# Patient Record
Sex: Male | Born: 1995
Health system: Southern US, Community
[De-identification: ages and names within clinical notes are randomized; demographics above are authoritative.]

## PROBLEM LIST (undated history)

## (undated) DIAGNOSIS — J9819 Other pulmonary collapse: Secondary | ICD-10-CM

## (undated) DIAGNOSIS — G43909 Migraine, unspecified, not intractable, without status migrainosus: Secondary | ICD-10-CM

## (undated) HISTORY — DX: Other pulmonary collapse: J98.19

## (undated) HISTORY — DX: Migraine, unspecified, not intractable, without status migrainosus: G43.909

---
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
## 2015-02-01 ENCOUNTER — Ambulatory Visit (INDEPENDENT_AMBULATORY_CARE_PROVIDER_SITE_OTHER): Payer: 59 | Admitting: Family Medicine

## 2015-02-01 ENCOUNTER — Encounter: Payer: Self-pay | Admitting: Family Medicine

## 2015-02-01 VITALS — BP 102/64 | HR 80 | Temp 98.3°F | Resp 16 | Ht 75.0 in | Wt 154.0 lb

## 2015-02-01 DIAGNOSIS — S52509A Unspecified fracture of the lower end of unspecified radius, initial encounter for closed fracture: Secondary | ICD-10-CM | POA: Insufficient documentation

## 2015-02-01 DIAGNOSIS — F419 Anxiety disorder, unspecified: Secondary | ICD-10-CM

## 2015-02-01 DIAGNOSIS — X58XXXA Exposure to other specified factors, initial encounter: Secondary | ICD-10-CM | POA: Insufficient documentation

## 2015-02-01 DIAGNOSIS — F32A Depression, unspecified: Secondary | ICD-10-CM | POA: Insufficient documentation

## 2015-02-01 DIAGNOSIS — R079 Chest pain, unspecified: Secondary | ICD-10-CM | POA: Insufficient documentation

## 2015-02-01 DIAGNOSIS — H04559 Acquired stenosis of unspecified nasolacrimal duct: Secondary | ICD-10-CM | POA: Insufficient documentation

## 2015-02-01 DIAGNOSIS — J90 Pleural effusion, not elsewhere classified: Secondary | ICD-10-CM | POA: Insufficient documentation

## 2015-02-01 DIAGNOSIS — G43009 Migraine without aura, not intractable, without status migrainosus: Secondary | ICD-10-CM | POA: Insufficient documentation

## 2015-02-01 DIAGNOSIS — F988 Other specified behavioral and emotional disorders with onset usually occurring in childhood and adolescence: Secondary | ICD-10-CM

## 2015-02-01 DIAGNOSIS — F329 Major depressive disorder, single episode, unspecified: Secondary | ICD-10-CM | POA: Insufficient documentation

## 2015-02-01 DIAGNOSIS — K589 Irritable bowel syndrome without diarrhea: Secondary | ICD-10-CM | POA: Insufficient documentation

## 2015-02-01 DIAGNOSIS — F909 Attention-deficit hyperactivity disorder, unspecified type: Secondary | ICD-10-CM | POA: Diagnosis not present

## 2015-02-01 DIAGNOSIS — J939 Pneumothorax, unspecified: Secondary | ICD-10-CM | POA: Insufficient documentation

## 2015-02-01 MED ORDER — SERTRALINE HCL 50 MG PO TABS: 50.0000 mg | ORAL_TABLET | Freq: Every day | ORAL | Status: DC

## 2015-02-01 NOTE — Progress Notes (Signed)
Subjective:    Patient ID: Tanner Brown, male    DOB: 1995/09/20, 19 y.o.   MRN: [id number]  HPI Comments: Is concerned about leaving his parents and his girlfriend.   Anxiety Presents for initial visit. Onset was 1 to 6 months ago. The problem has been unchanged. Symptoms include excessive worry, nausea (Only when feeling anxious.  ), nervous/anxious behavior and restlessness. Patient reports no chest pain, compulsions, confusion, decreased concentration, depressed mood, dizziness, dry mouth, feeling of choking, hyperventilation, insomnia, irritability, malaise, muscle tension, palpitations, panic, shortness of breath or suicidal ideas. Symptoms occur occasionally. The severity of symptoms is mild. Exacerbated by: Going to college in the Fall.  The quality of sleep is fair.   His past medical history is significant for depression.    Patient Active Problem List   Diagnosis Date Noted  . Accident 02/01/2015  . ADD (attention deficit disorder) 02/01/2015  . Chest pain 02/01/2015  . Clinical depression 02/01/2015  . Closed fracture of distal end of radius 02/01/2015  . Adaptive colitis 02/01/2015  . Migraine without aura and responsive to treatment 02/01/2015  . 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 Pleural cavity effusion 02/01/2015  . Pneumothorax 02/01/2015  . Spontaneous pneumothorax 09/05/2014   Family History  Problem Relation Age of Onset  . Depression Mother   . Depression Father    History   Social History  . Marital Status: Single    Spouse Name: N/A  . Number of Children: N/A  . Years of Education: H/S   Occupational History  . [employer]    Social History Main Topics  . Smoking status: Never Smoker   . Smokeless tobacco: Never Used  . Alcohol Use: No  . Drug Use: No  . Sexual Activity: Not on file   Other Topics Concern  . Not on file   Social History Narrative   Past Surgical History  Procedure Laterality Date  . Pleural  scarification  2015   No Known Allergies Previous Medications   SUMATRIPTAN (IMITREX) 100 MG TABLET    Take by mouth.   BP 102/64 mmHg  Pulse 80  Temp(Src) 98.3 F (36.8 C) (Oral)  Resp 16  Ht 6\' 3"  (1.905 m)  Wt 154 lb (69.854 kg)  BMI 19.25 kg/m2    Review of Systems  Constitutional: Positive for fatigue. Negative for fever, chills, diaphoresis, activity change, appetite change, irritability and unexpected weight change.  Respiratory: Negative for apnea, cough, choking, chest tightness, shortness of breath, wheezing and stridor.   Cardiovascular: Negative for chest pain, palpitations and leg swelling.  Gastrointestinal: Positive for nausea (Only when feeling anxious.  ). Negative for vomiting, abdominal pain, diarrhea, constipation, blood in stool, abdominal distention, anal bleeding and rectal pain.  Musculoskeletal: Negative for myalgias, back pain, joint swelling, arthralgias, gait problem, neck pain and neck stiffness.  Neurological: Negative for dizziness, tremors, seizures, syncope, speech difficulty, weakness, light-headedness, numbness and headaches.  Psychiatric/Behavioral: Negative for suicidal ideas, confusion and decreased concentration. The patient is nervous/anxious. The patient does not have insomnia.        Objective:   Physical Exam  Constitutional: He is oriented to person, place, and time. He appears well-developed and well-nourished.  Neurological: He is alert and oriented to person, place, and time.  Psychiatric: He has a normal mood and affect. His behavior is normal. Judgment and thought content normal.   BP 102/64 mmHg  Pulse 80  Temp(Src) 98.3 F (36.8 C) (Oral)  Resp 16  Ht  (1.905 m)  Wt 154 lb (69.854 kg)  BMI 19.25 kg/m2     Assessment & Plan:  1. Anxiety Worsening. Start medication. Did have long talk with patient without dad in the room. Also,with dad present. Migraines not current issue. Will sign up for My Chart and be in  communication with me that way.  Did discuss other issues related to health along with anxiety.  Issues related to his girlfriend.  Understands that what we discuss is confidential.  - sertraline (ZOLOFT) 50 MG tablet; Take 1 tablet (50 mg total) by mouth daily.  Dispense: 90 tablet; Refill: 3  2. ADD (attention deficit disorder) Will treat if needed.   Lorie Phenix, MD

## 2015-04-19 ENCOUNTER — Ambulatory Visit: Payer: 59 | Admitting: Family Medicine

## 2015-07-14 DIAGNOSIS — J9819 Other pulmonary collapse: Secondary | ICD-10-CM

## 2015-07-14 HISTORY — DX: Other pulmonary collapse: J98.19

## 2015-09-16 IMAGING — CR DG CHEST 1V PORT
1 series · 2 of 2 positions shown · non-contrast
Comparison: 05/02/2014 earlier same day

CLINICAL DATA: Left-sided pneumothorax, left-sided chest tube
present

EXAM:
PORTABLE CHEST - 1 VIEW

[Series 1: ap · 0.17mm/px · 2 of 2 slices shown]
[im 1/2]
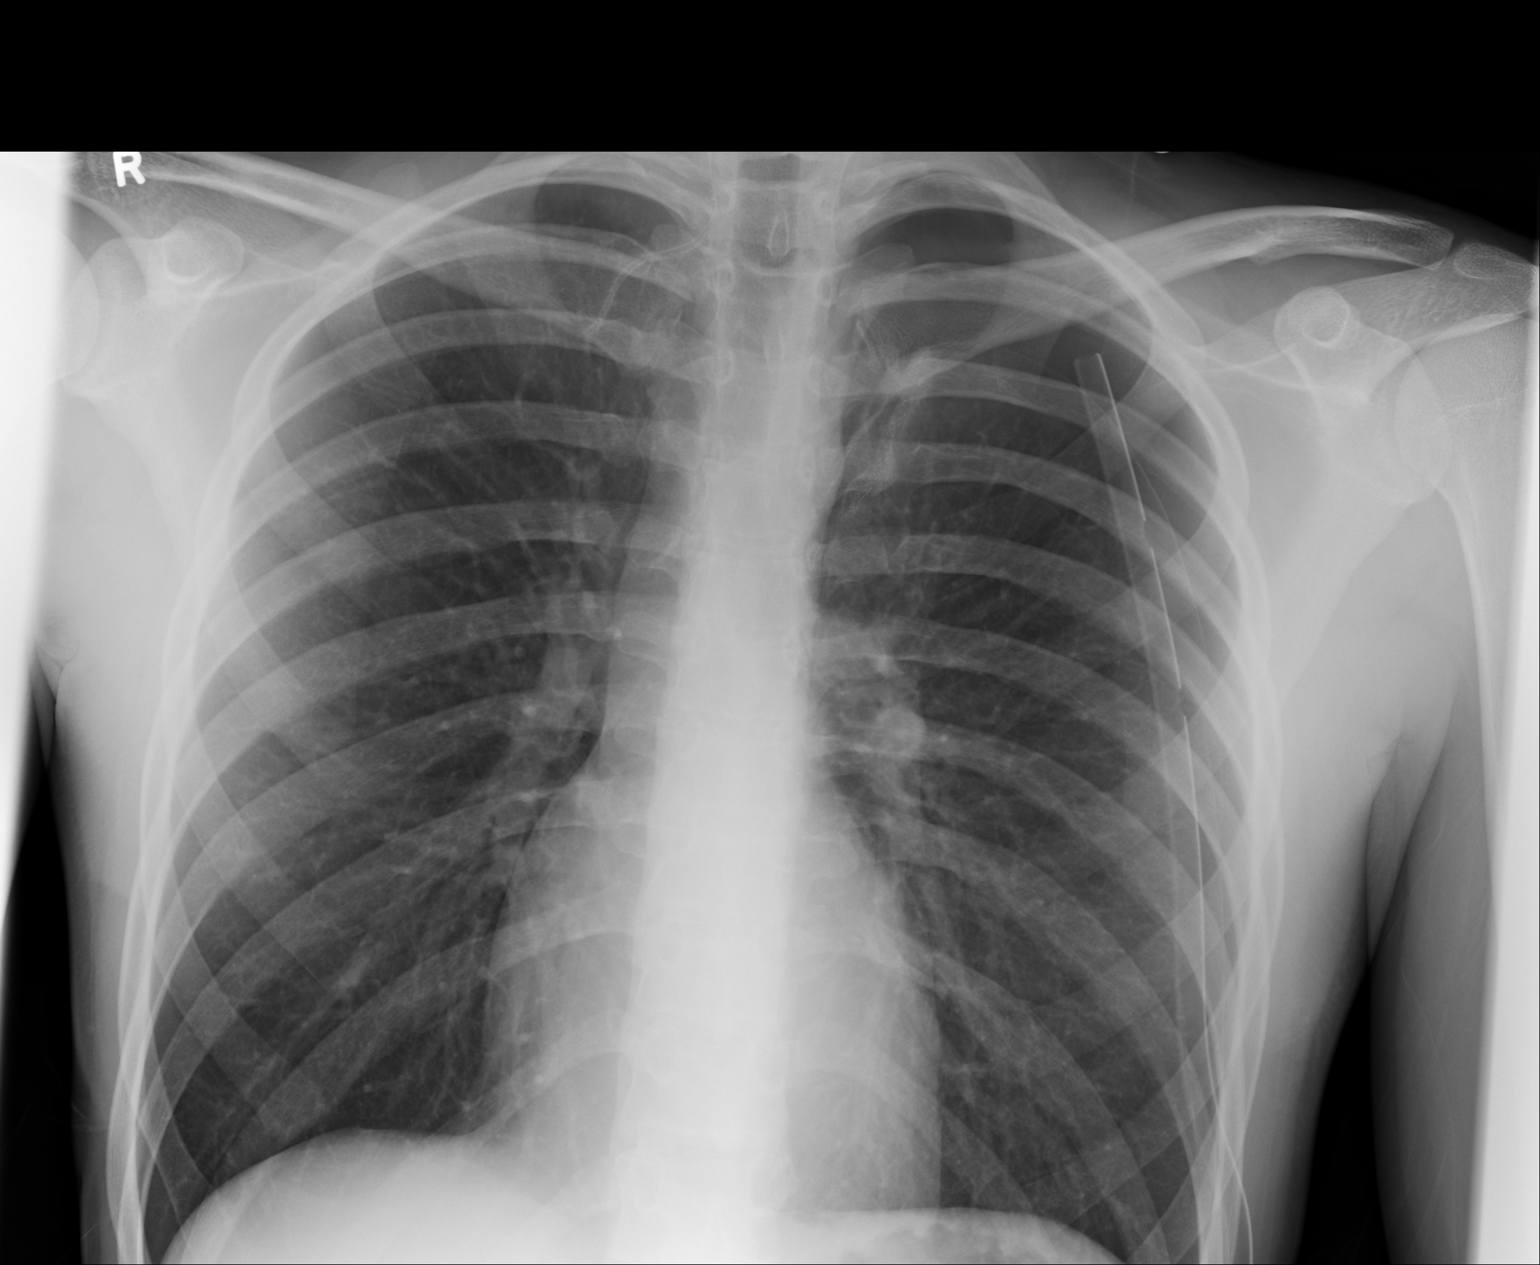
[im 2/2]
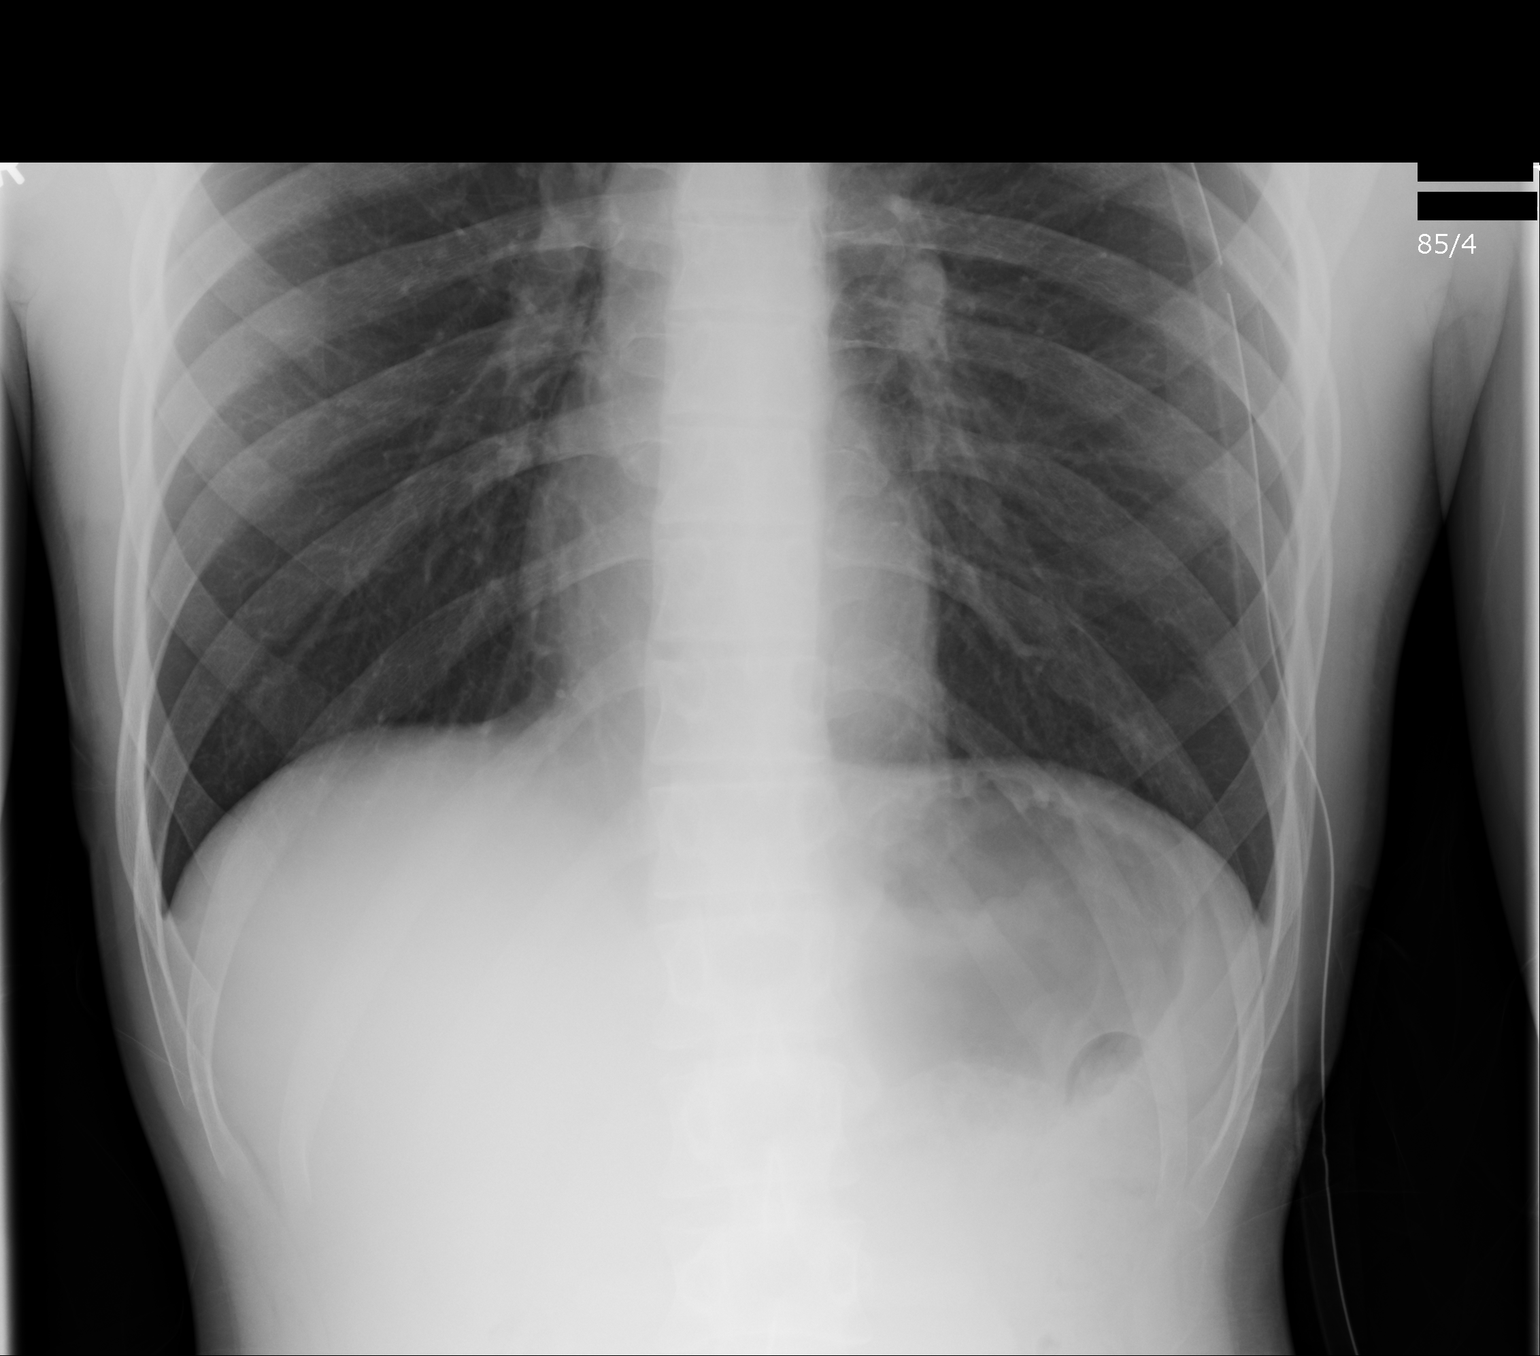

[2 of 2 positions shown; findings below may reference images not displayed]

FINDINGS: Left-sided chest tube directed towards the apex. Moderate left
apical pneumothorax which has enlarged compared with the exam
performed earlier same day.

The lungs are clear. No pleural effusion or focal consolidation. The
heart and mediastinal contours are unremarkable. The osseous
structures are unremarkable.
IMPRESSION: Left-sided chest tube directed towards the apex in unchanged
position. Interval enlargement of the left apical pneumothorax
currently measuring 4.1 cm at the apex compared with the earlier
prior exam at which time the pneumothorax measured 1.7 cm at the
apex.

## 2015-09-17 IMAGING — CR DG CHEST 1V PORT
1 series · 2 of 2 positions shown · non-contrast
Comparison: 05/02/2014

CLINICAL DATA: Left pneumothorax

EXAM:
PORTABLE CHEST - 1 VIEW

[Series 1: ap · 0.17mm/px · 2 of 2 slices shown]
[im 1/2]
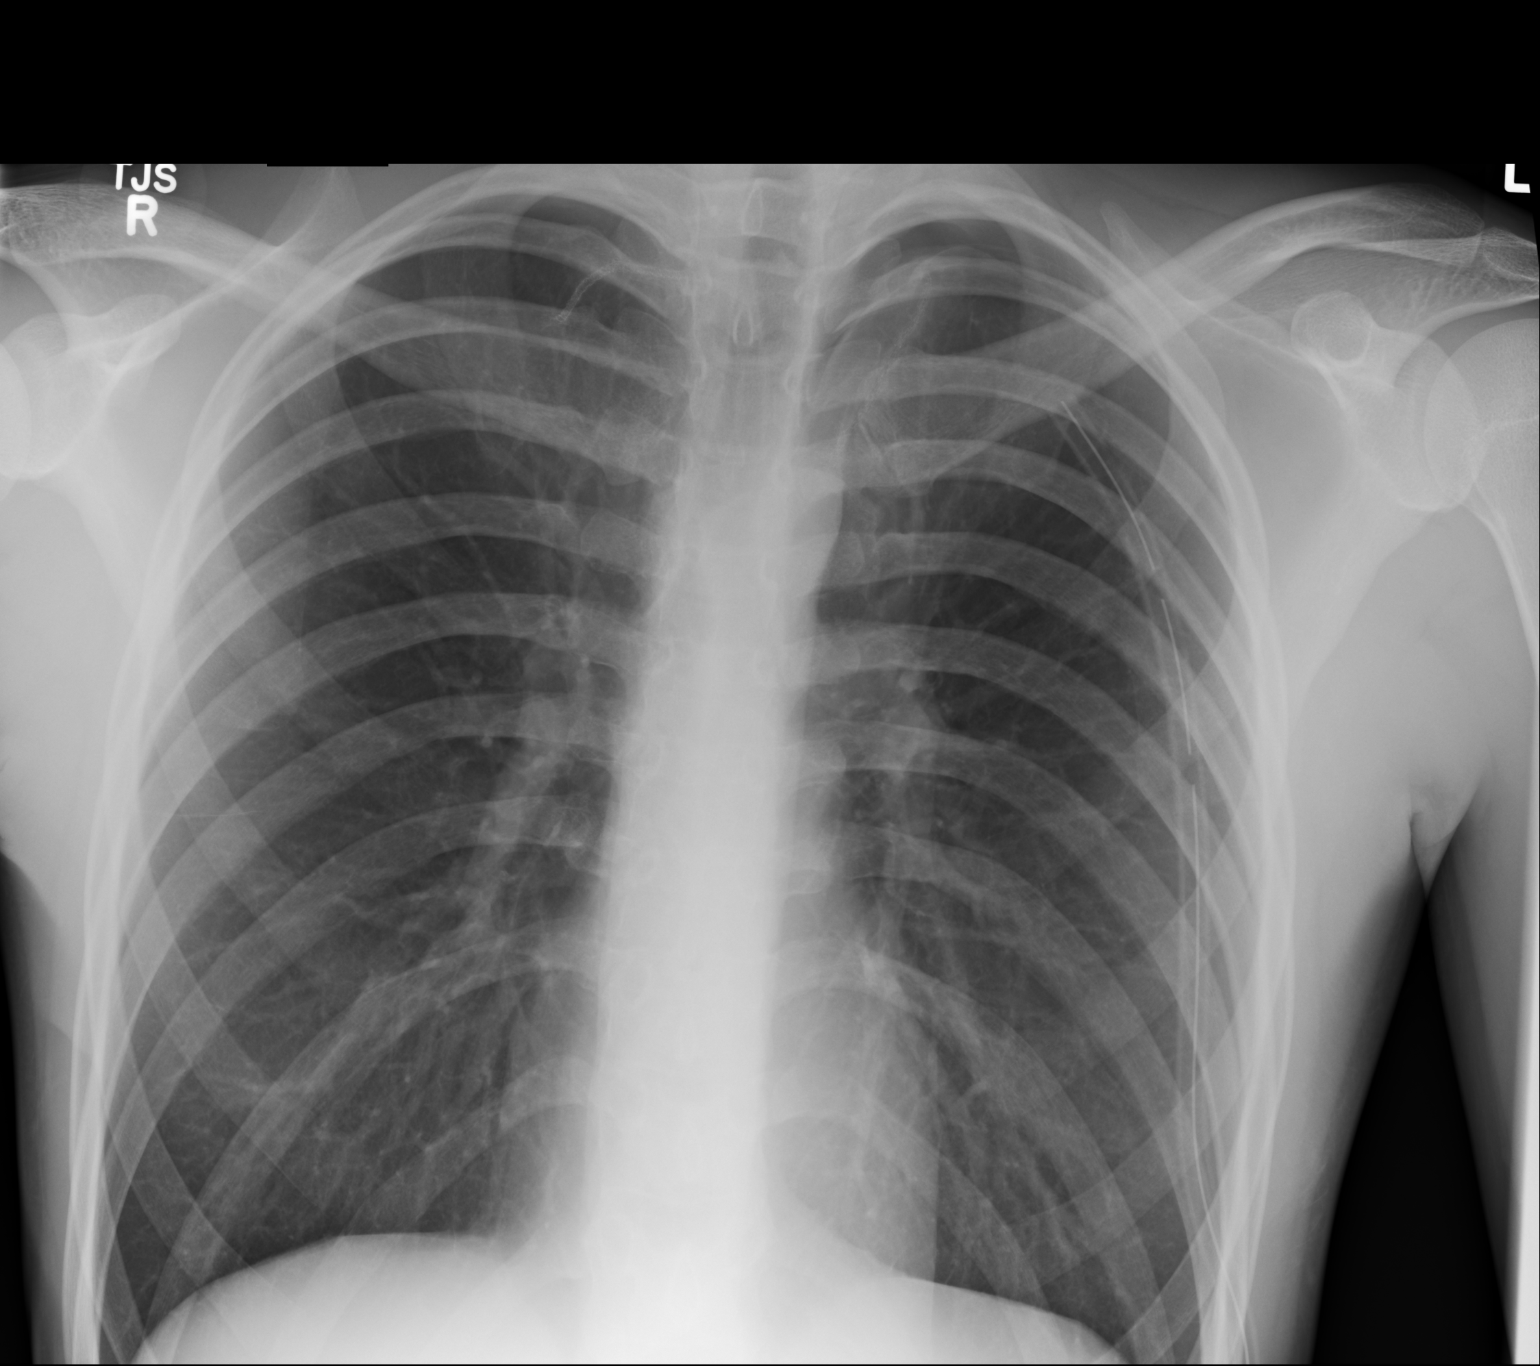
[im 2/2]
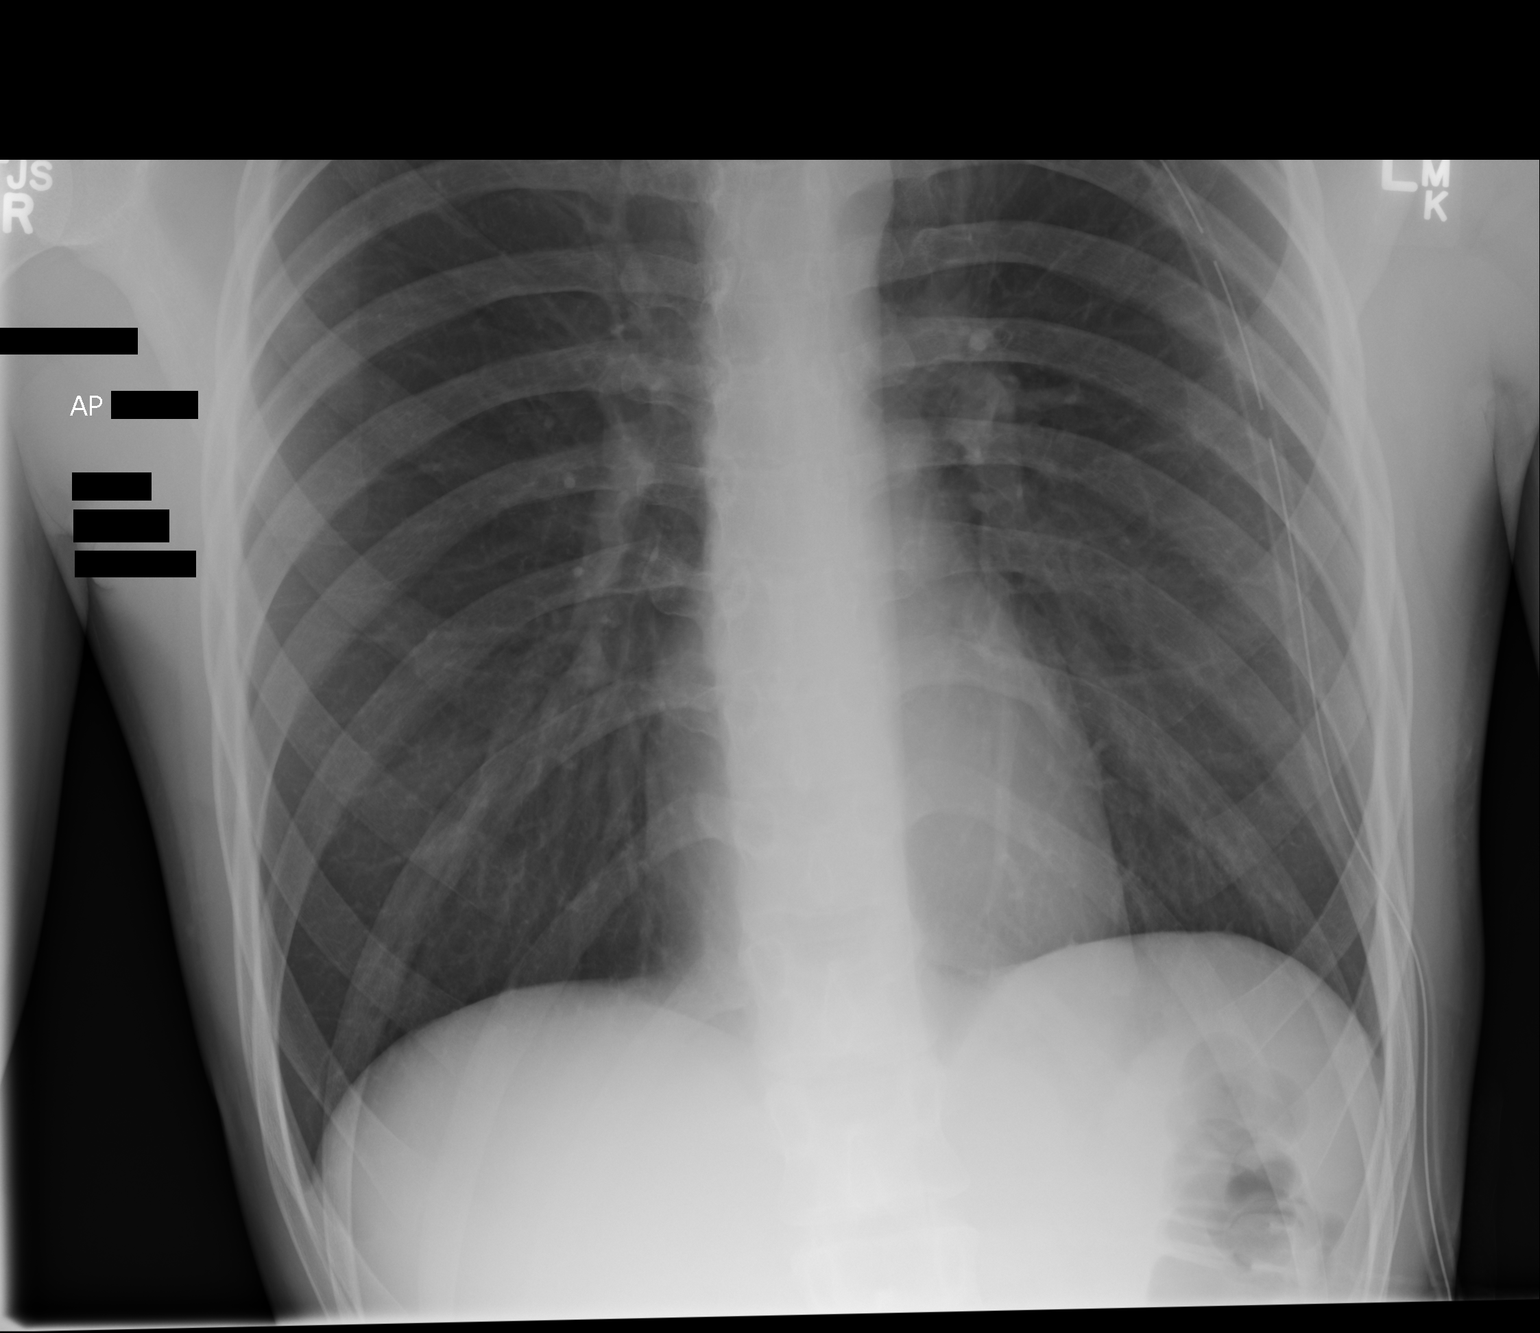

[2 of 2 positions shown; findings below may reference images not displayed]

FINDINGS: Cardiac shadow is within normal limits. A left-sided thoracostomy
catheter remains in place. Postsurgical changes are again noted. A
small left pneumothorax is seen but as improved significantly from
the prior exam. Continued followup is recommended.
IMPRESSION: Significant decrease in left-sided pneumothorax.

## 2015-09-18 IMAGING — CR DG CHEST 1V PORT
1 series · 1 of 1 positions shown · non-contrast
Comparison: Portable chest x-ray of earlier today

CLINICAL DATA: Follow-up of pneumothorax

EXAM:
PORTABLE CHEST - 1 VIEW

[ap]
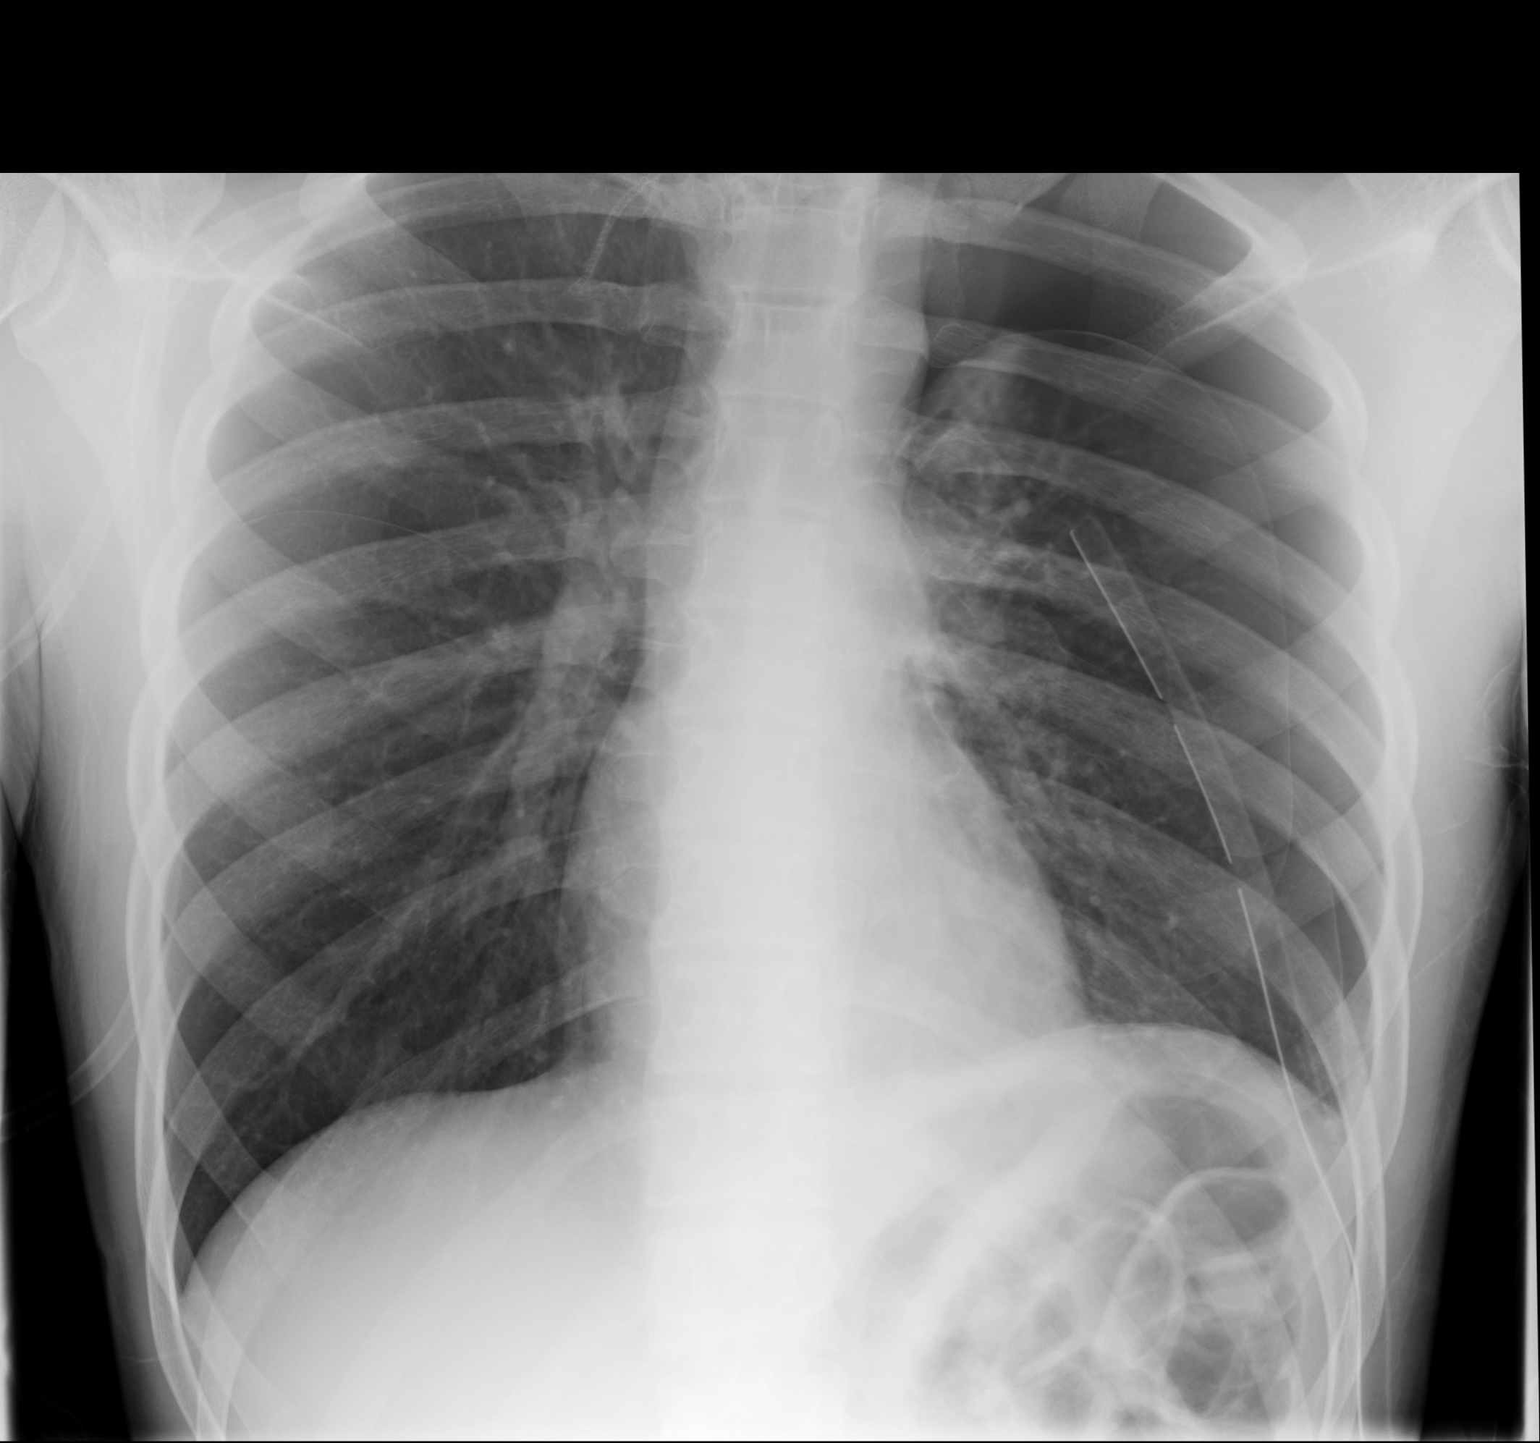

[1 of 1 positions shown; findings below may reference images not displayed]

FINDINGS: The pneumothorax on the left has increased in volume and now amounts
to approximately 50% of the lung volume. There is no mediastinal
shift. There is no pleural effusion. The heart and pulmonary
vascularity are normal.
IMPRESSION: There has been interval increase in a volume of the left
pneumothorax such it now involves approximately 50% of the lung
volume.

Critical Value/emergent results were called by telephone at the time
of interpretation on 05/04/2014 at [DATE] to Celia Angelini, RN,
Charge Nurse,, who verbally acknowledged these results.

## 2015-09-18 IMAGING — CR DG CHEST 1V PORT
1 series · 1 of 1 positions shown · non-contrast
Comparison: Chest x-ray 05/04/2014.

CLINICAL DATA: 18-year-old male with history of pneumothorax with
chest tube in position. Status post tube adjustment.

EXAM:
PORTABLE CHEST - 1 VIEW

[ap]
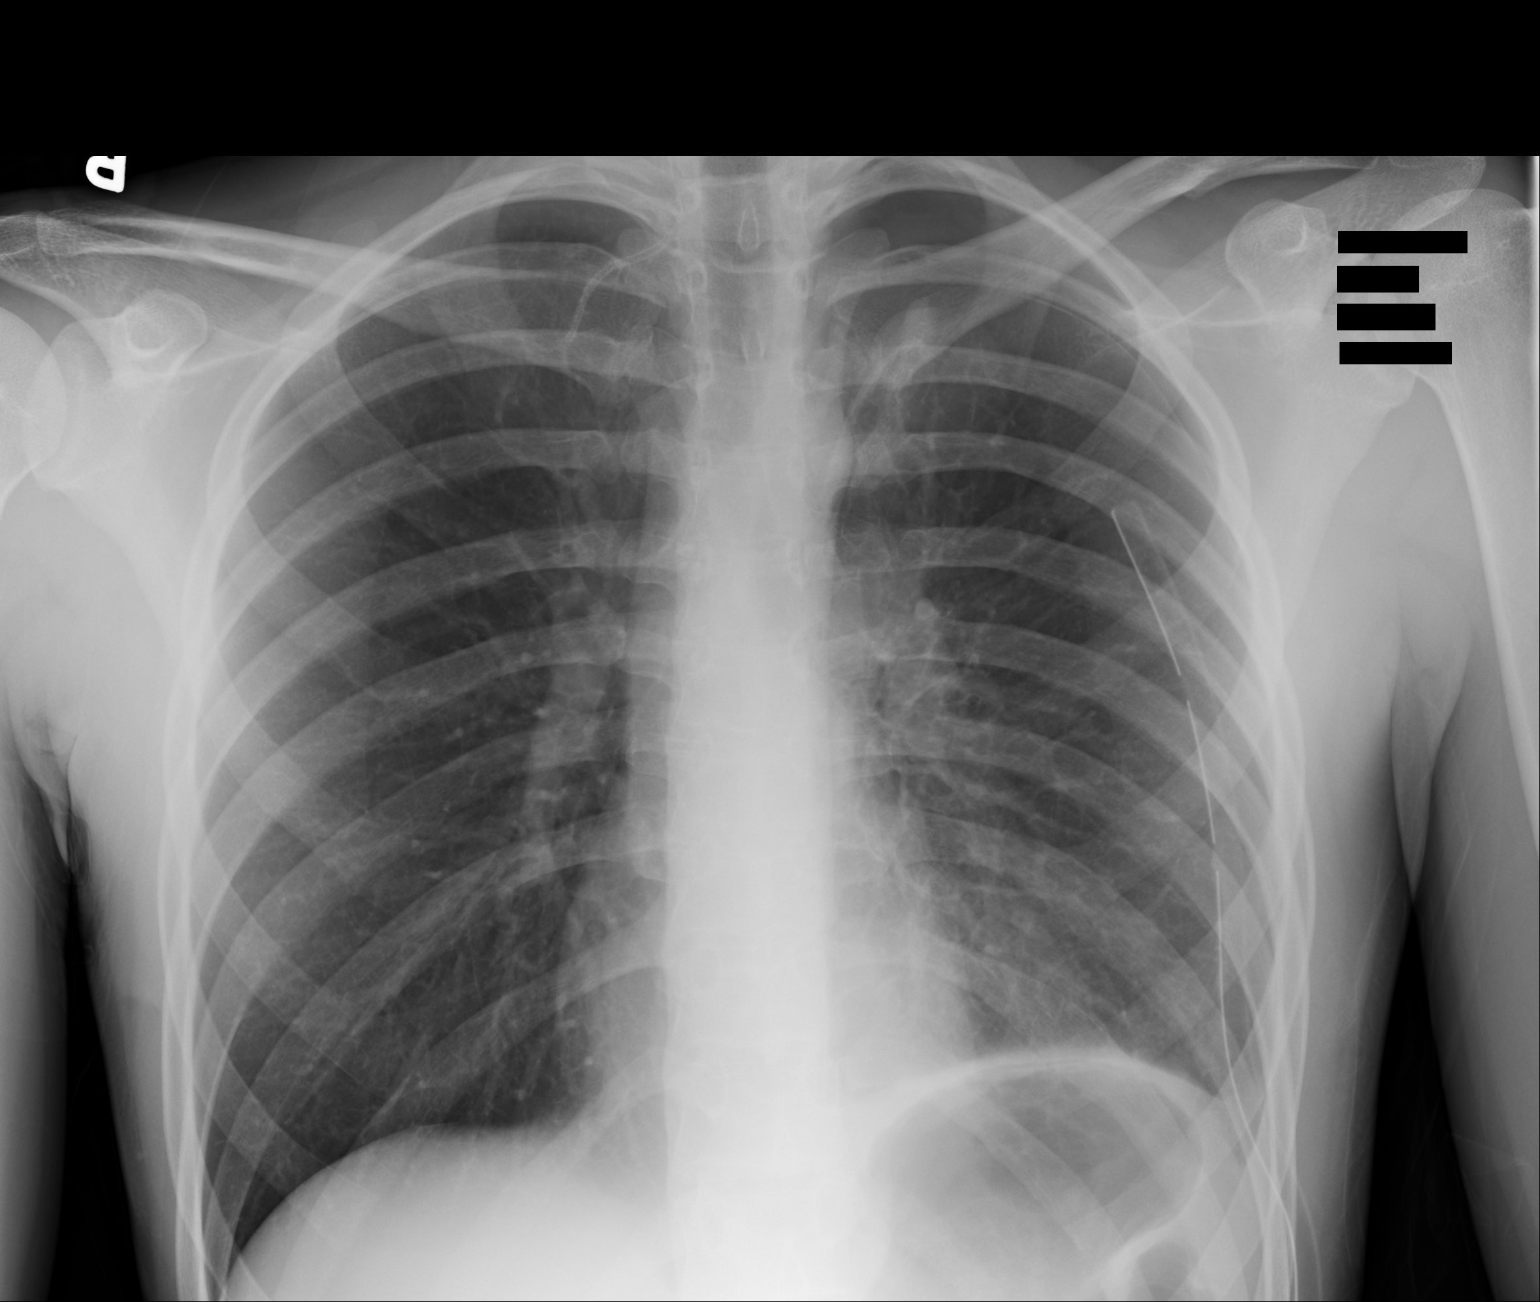

[1 of 1 positions shown; findings below may reference images not displayed]

FINDINGS: Previously noted left-sided chest tube is slightly more lateral in
the left hemithorax, but the tip and 2 side-port superior to project
over the left hemithorax. There has been some interval re-expansion
of the left lung, although there continues to be a moderate
left-sided pneumothorax (approximately 10-15% of the volume of the
left hemithorax). Right lung is well-expanded. No consolidative
airspace disease or pleural effusions. No evidence of pulmonary
edema. Heart size and mediastinal contours are unremarkable.
IMPRESSION: 1. Slight repositioning of the left-sided chest tube with some
interval re-expansion of the left lung, with residual moderate left
pneumothorax, as above.

## 2015-09-20 IMAGING — CR DG CHEST 1V PORT
1 series · 2 of 2 positions shown · non-contrast
Comparison: Portable exam 8803 hr compared to 0213 hr

CLINICAL DATA: Postsurgical chest tube placement

EXAM:
PORTABLE CHEST - 1 VIEW

[Series 1: ap · 0.17mm/px · 2 of 2 slices shown]
[im 1/2]
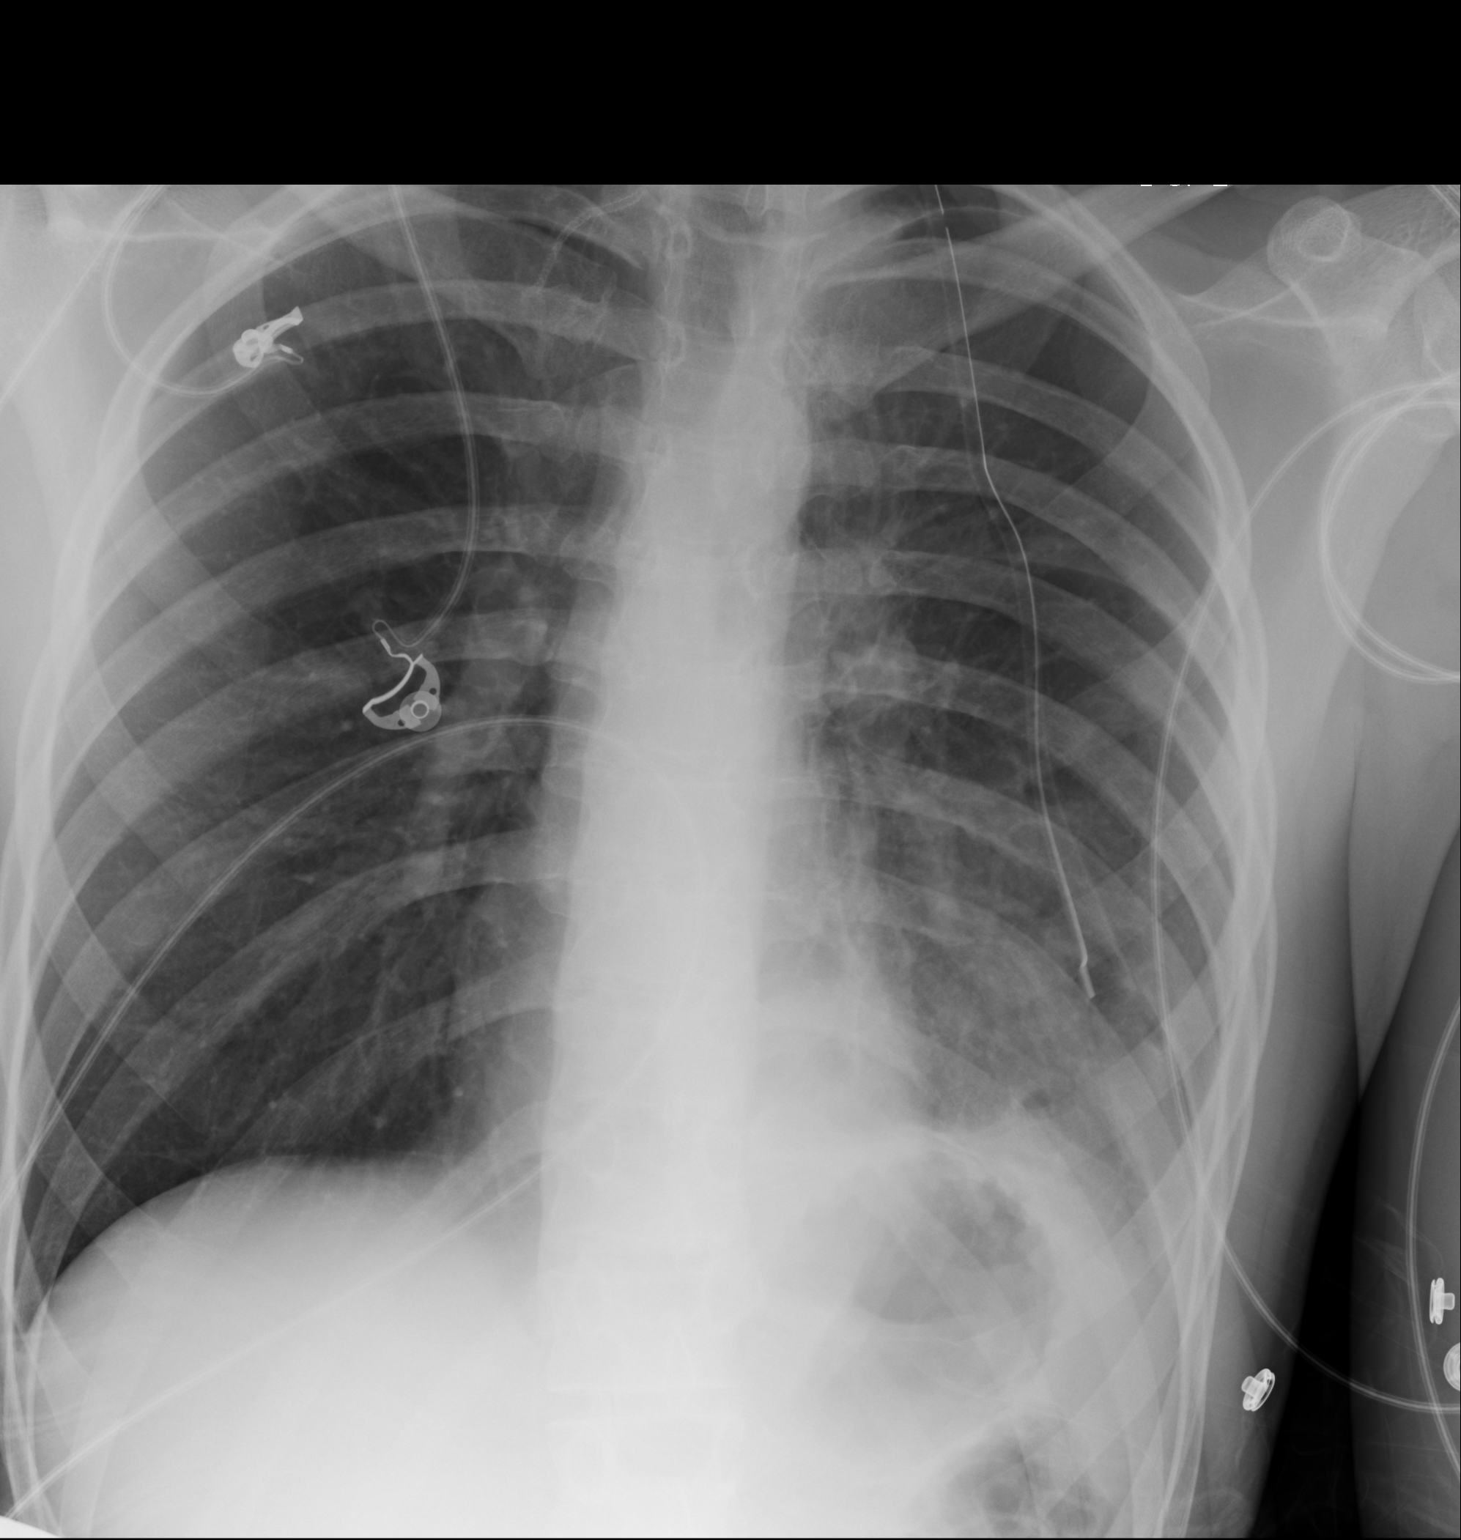
[im 2/2]
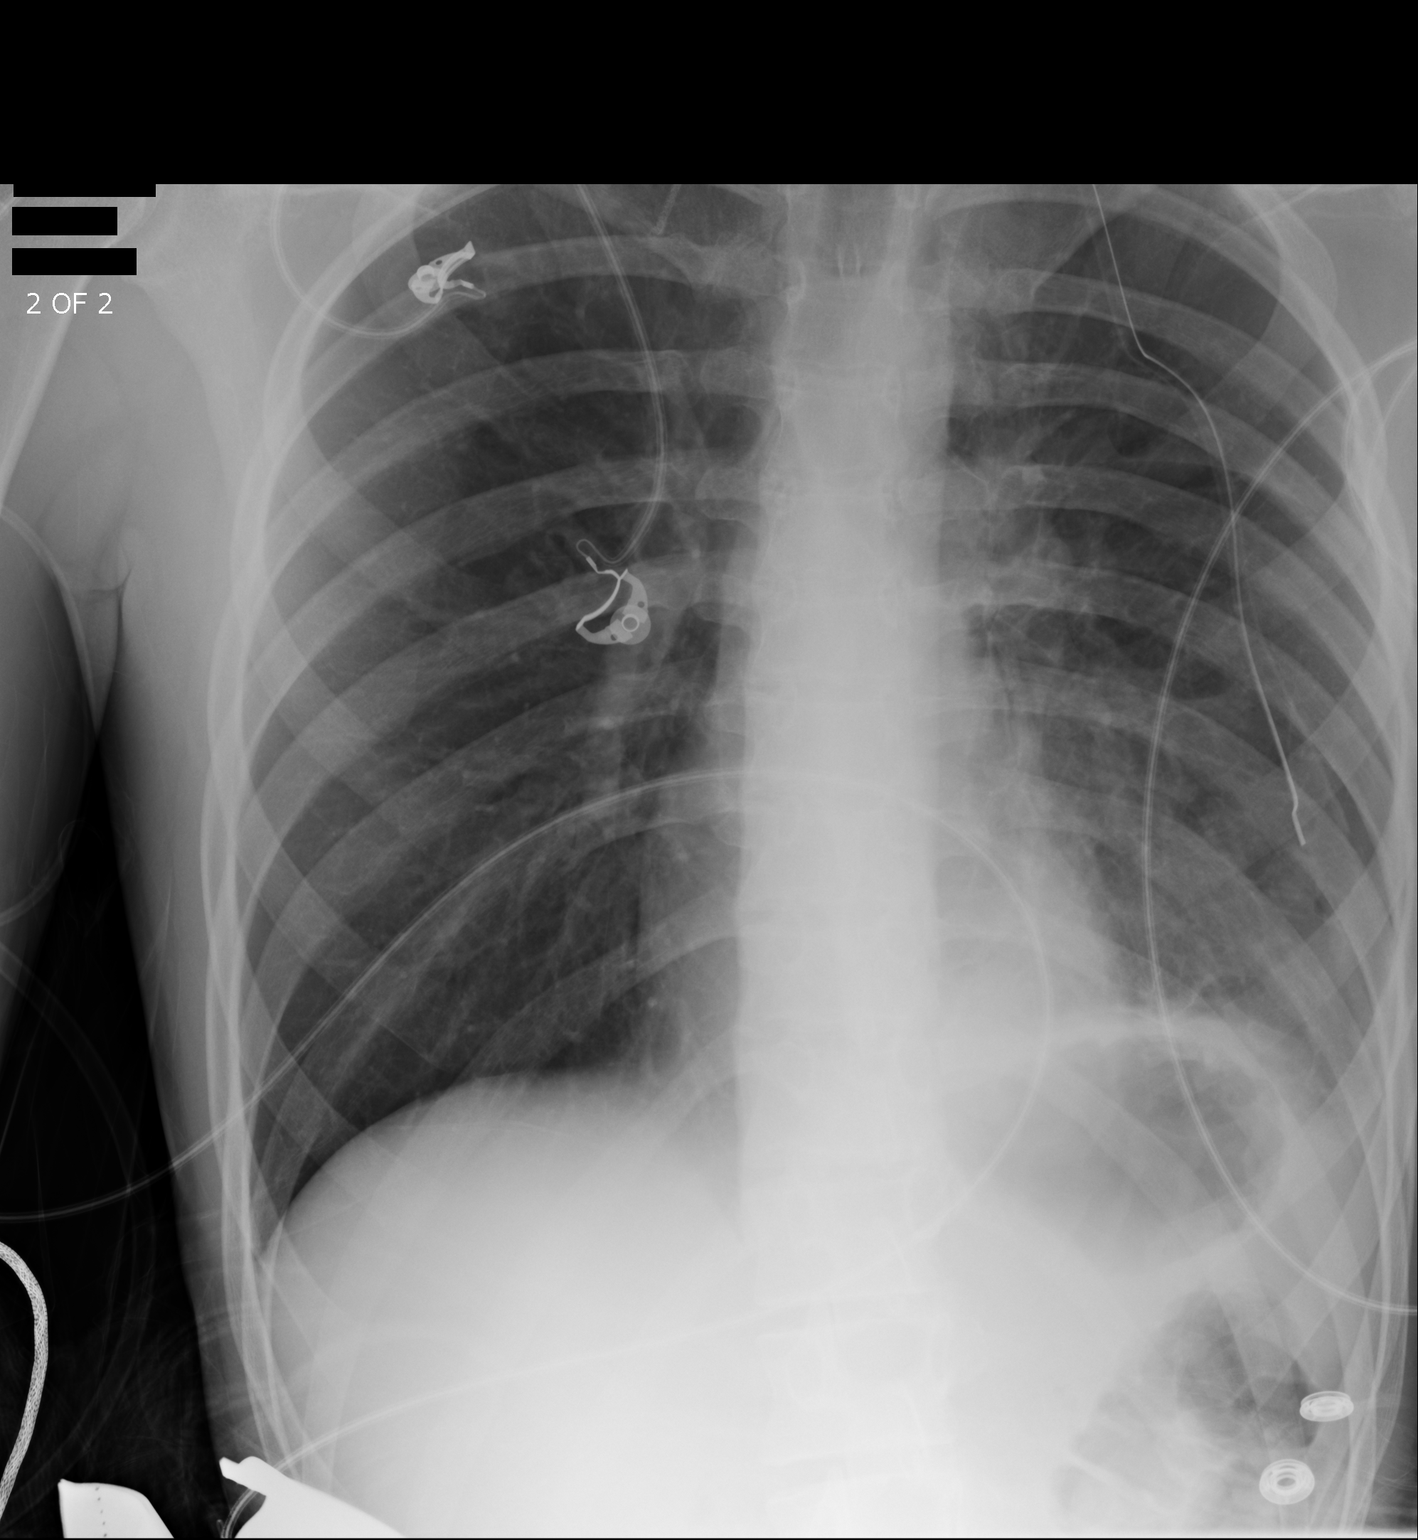

[2 of 2 positions shown; findings below may reference images not displayed]

FINDINGS: LEFT thoracostomy tube again identified with tip at apex.

Small residual LEFT apex pneumothorax significantly decreased since
previous exam.

Staple line at RIGHT apex from prior resection.

Normal heart size, mediastinal contours and pulmonary vascularity.

Persistent atelectasis in LEFT lung particularly lower lobe.

Remaining lungs clear.

No pleural effusion or acute osseous findings.
IMPRESSION: Significant decrease in LEFT pneumothorax post thoracostomy tube.

Decreased LEFT lung atelectasis.

Prior RIGHT apex resection.

## 2016-01-13 ENCOUNTER — Encounter: Payer: Self-pay | Admitting: Family Medicine

## 2016-01-13 ENCOUNTER — Ambulatory Visit
Admission: RE | Admit: 2016-01-13 | Discharge: 2016-01-13 | Disposition: A | Discharge status: Home / Self Care | Payer: 59 | Source: Ambulatory Visit | Attending: Family Medicine | Admitting: Family Medicine

## 2016-01-13 ENCOUNTER — Ambulatory Visit (INDEPENDENT_AMBULATORY_CARE_PROVIDER_SITE_OTHER): Payer: 59 | Admitting: Family Medicine

## 2016-01-13 VITALS — BP 122/80 | HR 82 | Temp 97.4°F | Resp 18

## 2016-01-13 DIAGNOSIS — R079 Chest pain, unspecified: Secondary | ICD-10-CM | POA: Diagnosis not present

## 2016-01-13 DIAGNOSIS — R0602 Shortness of breath: Secondary | ICD-10-CM

## 2016-01-13 DIAGNOSIS — R0789 Other chest pain: Secondary | ICD-10-CM | POA: Diagnosis present

## 2016-01-13 DIAGNOSIS — R918 Other nonspecific abnormal finding of lung field: Secondary | ICD-10-CM | POA: Diagnosis not present

## 2016-01-13 DIAGNOSIS — Z8709 Personal history of other diseases of the respiratory system: Secondary | ICD-10-CM

## 2016-01-13 DIAGNOSIS — J189 Pneumonia, unspecified organism: Secondary | ICD-10-CM | POA: Diagnosis not present

## 2016-01-13 MED ORDER — AZITHROMYCIN 250 MG PO TABS: ORAL_TABLET | ORAL | Status: DC

## 2016-01-13 NOTE — Progress Notes (Addendum)
Patient ID: Tanner Brown, male   DOB: [birth year], 20 y.o.   MRN: [id number]    Subjective:  HPI Pt is here today for a sudden onset of shortness of breath. He also has chest pain in his left side and around to his upper back. Pt has history of bilateral pneumothorax. This discomfort and shortness of breath is exactly how it felt when he had pneumothorax in 2015. Dr. Jamal Collin is not in the office today. It is the holiday.  Prior to Admission medications   Medication Sig Start Date End Date Taking? Authorizing Provider  sertraline (ZOLOFT) 50 MG tablet Take 1 tablet (50 mg total) by mouth daily. 02/01/15  Yes Margarita Rana, MD  SUMAtriptan (IMITREX) 100 MG tablet Take by mouth. 10/01/14  Yes Historical Provider, MD    Patient Active Problem List   Diagnosis Date Noted  . Accident 02/01/2015  . ADD (attention deficit disorder) 02/01/2015  . Chest pain 02/01/2015  . Closed fracture of distal end of radius 02/01/2015  . Adaptive colitis 02/01/2015  . Migraine without aura and responsive to treatment 02/01/2015  . 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 Pleural cavity effusion 02/01/2015  . Spontaneous pneumothorax 09/05/2014    History reviewed. No pertinent past medical history.  Social History   Social History  . Marital Status: Single    Spouse Name: N/A  . Number of Children: N/A  . Years of Education: H/S   Occupational History  . [employer]    Social History Main Topics  . Smoking status: Never Smoker   . Smokeless tobacco: Never Used  . Alcohol Use: No  . Drug Use: No  . Sexual Activity: Not on file   Other Topics Concern  . Not on file   Social History Narrative    No Known Allergies  Review of Systems  Constitutional: Negative.   HENT: Negative.   Eyes: Negative.   Respiratory: Positive for shortness of breath.   Cardiovascular: Positive for chest pain.  Gastrointestinal: Negative.   Genitourinary: Negative.   Musculoskeletal: Positive for  back pain.  Skin: Negative.   Neurological: Negative.   Endo/Heme/Allergies: Negative.   Psychiatric/Behavioral: Negative.     Immunization History  Administered Date(s) Administered  . DTaP 05/23/1996, 07/26/1996, 10/05/1996, 08/17/1997, 03/11/2001  . Hepatitis A 05/25/2006  . Hepatitis B 06/02/1996, 05/23/1996, 10/05/1996  . HiB (PRP-OMP) 05/23/1996, 10/05/1996, 08/17/1997  . IPV 05/23/1996, 07/26/1996, 10/05/1996, 03/11/2001  . MMR 06/16/1998, 03/11/2001  . Meningococcal Polysaccharide 03/23/2012  . Varicella 08/17/1997, 05/25/2006   Objective:  BP 122/80 mmHg  Pulse 82  Temp(Src) 97.4 F (36.3 C)  Resp 18  SpO2 100%  Physical Exam  Constitutional: He is well-developed, well-nourished, and in no distress.  HENT:  Head: Normocephalic and atraumatic.  Right Ear: External ear normal.  Left Ear: External ear normal.  Nose: Nose normal.  Eyes: Conjunctivae are normal.  Neck: Neck supple.  Cardiovascular: Normal rate, regular rhythm and normal heart sounds.   Pulmonary/Chest: Effort normal and breath sounds normal. No respiratory distress. He has no wheezes. He has no rales. He exhibits no tenderness.  Skin: Skin is warm and dry.  Psychiatric: Mood, memory, affect and judgment normal.    No results found for: WBC, HGB, HCT, PLT, GLUCOSE, CHOL, TRIG, HDL, LDLDIRECT, LDLCALC, TSH, PSA, INR, GLUF, HGBA1C, MICROALBUR  CMP  No results found for: NA, K, CL, CO2, GLUCOSE, BUN, CREATININE, CALCIUM, PROT, ALBUMIN, AST, ALT, ALKPHOS, BILITOT, GFRNONAA, GFRAA  Assessment and Plan :  1. Shortness of breath  - DG Chest 2 View; Future - DG Chest 2 View; Future  2. Chest pain, unspecified chest pain type Chest x-ray is consistent with pneumonia. After discussion with Dr. Caryn Section will treat with Z-Pak and will follow with Dr. Caryn Section in 1 month. - DG Chest 2 View; Future - DG Chest 2 View; Future  3. History of pneumothorax Will  follow up with Dr. Caryn Section and/or Dr.  Romie Minus. - DG Chest 2 View; Future - DG Chest 2 View; Future Expiratory CXR. I have done the exam and reviewed the above chart and it is accurate to the best of my knowledge.   Miguel Aschoff MD [facility] 01/13/2016 10:59 AM

## 2016-01-13 NOTE — Addendum Note (Signed)
Addended by: Cyndia BentBYRD, Idona Stach M on: 01/13/2016 01:35 PM   Modules accepted: Orders

## 2016-02-10 DIAGNOSIS — H5213 Myopia, bilateral: Secondary | ICD-10-CM | POA: Diagnosis not present

## 2016-04-20 ENCOUNTER — Ambulatory Visit (INDEPENDENT_AMBULATORY_CARE_PROVIDER_SITE_OTHER): Payer: 59 | Admitting: Family Medicine

## 2016-04-20 ENCOUNTER — Ambulatory Visit
Admission: RE | Admit: 2016-04-20 | Discharge: 2016-04-20 | Disposition: A | Discharge status: Home / Self Care | Payer: 59 | Source: Ambulatory Visit | Attending: Family Medicine | Admitting: Family Medicine

## 2016-04-20 VITALS — BP 100/64 | HR 60 | Temp 97.6°F | Resp 14 | Wt 171.0 lb

## 2016-04-20 DIAGNOSIS — R079 Chest pain, unspecified: Secondary | ICD-10-CM

## 2016-04-20 DIAGNOSIS — Z8709 Personal history of other diseases of the respiratory system: Secondary | ICD-10-CM

## 2016-04-20 NOTE — Progress Notes (Addendum)
CHANDLOR NOECKER  MRN: [id number] DOB: 1995-08-23  Subjective:  HPI   The patient is a 20 year old male who presents for evaluation of right sided rib area pain, at the base of the ribs through to the back.  He states he noted it 2 days ago.  He complains of having a cough for about 3 weeks with it being non productive.  He feels like he has congestion in his chest but is not able to cough anything up.  The pain is worse with deep breathing and yawning, but he has not noticed that the cough makes it any worse.  He has not had any known fever. He has a long complicated medical history remarkable for pneumothorax on both sides of required surgical intervention. He has had walking pneumonia in the past. He has had pleural effusion unrelated  to the other problems. He also has a history of migraine headaches. Patient Active Problem List   Diagnosis Date Noted  . Accident 02/01/2015  . ADD (attention deficit disorder) 02/01/2015  . Chest pain 02/01/2015  . Closed fracture of distal end of radius 02/01/2015  . Adaptive colitis 02/01/2015  . Migraine without aura and responsive to treatment 02/01/2015  . 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 Pleural cavity effusion 02/01/2015  . Spontaneous pneumothorax 09/05/2014    No past medical history on file.  Social History   Social History  . Marital status: Single    Spouse name: N/A  . Number of children: N/A  . Years of education: H/S   Occupational History  . [employer]    Social History Main Topics  . Smoking status: Never Smoker  . Smokeless tobacco: Never Used  . Alcohol use No  . Drug use: No  . Sexual activity: Not on file   Other Topics Concern  . Not on file   Social History Narrative  . No narrative on file    Outpatient Encounter Prescriptions as of 04/20/2016  Medication Sig Note  . SUMAtriptan (IMITREX) 100 MG tablet Take by mouth. 02/01/2015: Medication taken as needed. Max 2/day Received from:  [facility]  . [DISCONTINUED] azithromycin (ZITHROMAX) 250 MG tablet Take 2 tablets for 1st dose then 1 daily until finished   . [DISCONTINUED] sertraline (ZOLOFT) 50 MG tablet Take 1 tablet (50 mg total) by mouth daily.    No facility-administered encounter medications on file as of 04/20/2016.     No Known Allergies  Review of Systems  Constitutional: Positive for malaise/fatigue (over the weekend, thinks it may be stress related). Negative for chills and fever.  HENT: Negative for congestion, ear discharge, ear pain, hearing loss, nosebleeds, sore throat and tinnitus.   Eyes: Negative for blurred vision, double vision, photophobia, pain, discharge and redness.  Respiratory: Positive for cough and shortness of breath (especially with exertion). Negative for hemoptysis, sputum production and wheezing.   Cardiovascular: Negative for chest pain, palpitations, orthopnea and leg swelling.  Musculoskeletal: Positive for back pain (right sided rib pain going into the back).  Neurological: Negative for dizziness, weakness and headaches.  Psychiatric/Behavioral: Negative.    Objective:  BP 100/64   Pulse 60   Temp 97.6 F (36.4 C) (Oral)   Resp 14   Wt 171 lb (77.6 kg)   SpO2 96%   BMI 21.37 kg/m   Physical Exam  Constitutional: He is oriented to person, place, and time and well-developed, well-nourished, and in no distress.  HENT:  Head: Normocephalic and atraumatic.  Right Ear: External ear normal.  Left Ear: External ear normal.  Nose: Nose normal.  Mouth/Throat: Oropharynx is clear and moist.  Eyes: Conjunctivae are normal. Pupils are equal, round, and reactive to light. No scleral icterus.  Neck: No thyromegaly present.  Cardiovascular: Normal rate, regular rhythm and normal heart sounds.   Pulmonary/Chest: Effort normal and breath sounds normal.  Abdominal: Soft.  Lymphadenopathy:    He has no cervical adenopathy.  Neurological: He is alert and oriented to  person, place, and time.  Skin: Skin is warm and dry.  Psychiatric: Mood, memory, affect and judgment normal.    Assessment and Plan :  1. Chest pain, unspecified type Chronic bronchitis/asthmatic bronchitis likely etiology of this. In this time treat with fluids and NSAID's. If x-ray is clear with just follow the patient clinically. - DG Chest 2 View; Future - DG Chest Special View; Future  2. History of pneumothorax Also obtain expiratory PA of chest on xray. - DG Chest 2 View; Future - DG Chest Special View; Future I have done the exam and reviewed the chart and it is accurate to the best of my knowledge. Julieanne Mansonichard Patchin M.D. [facility]

## 2016-06-24 ENCOUNTER — Other Ambulatory Visit: Payer: Self-pay | Admitting: *Deleted

## 2016-06-24 DIAGNOSIS — F419 Anxiety disorder, unspecified: Secondary | ICD-10-CM

## 2016-06-24 MED ORDER — SERTRALINE HCL 50 MG PO TABS: 50.0000 mg | ORAL_TABLET | Freq: Every day | ORAL | 1 refills | Status: DC

## 2016-07-28 ENCOUNTER — Ambulatory Visit (INDEPENDENT_AMBULATORY_CARE_PROVIDER_SITE_OTHER): Payer: 59 | Admitting: Family Medicine

## 2016-07-28 VITALS — BP 102/64 | HR 70 | Temp 97.7°F | Resp 14

## 2016-07-28 DIAGNOSIS — R5383 Other fatigue: Secondary | ICD-10-CM

## 2016-07-28 DIAGNOSIS — R0602 Shortness of breath: Secondary | ICD-10-CM

## 2016-07-28 DIAGNOSIS — M545 Low back pain, unspecified: Secondary | ICD-10-CM

## 2016-07-28 NOTE — Progress Notes (Signed)
Tanner Brown  MRN: [id number] DOB: 11/01/1995  Subjective:  HPI  Patient states Tanner Brown has had trouble with back pain and shortness of breath for a while, can not say how long, at least over a week now. Tanner Brown usually will get a sharp stabbing pain in the back-below left shoulder blade and then will get short of breath. This happens together every time but at random times-no specific pattern, this can occur when Tanner Brown is just sitting down and not exerting himself. This morning Tanner Brown woke up and noticed that both feet had discoloration to them-purple color, left worse than right. No pain in the feet. Numbness present but patient states this is normal, they get numb a lot. No swelling in the feet that Tanner Brown could tell. Felt some nausea today. Has some weakness in the arms. Appetite has been ok. No chest pressure or tightness sensation. No injuries or trauma that Tanner Brown can recall to cause any of these symptoms.  Patient Active Problem List   Diagnosis Date Noted  . Accident 02/01/2015  . ADD (attention deficit disorder) 02/01/2015  . Chest pain 02/01/2015  . Closed fracture of distal end of radius 02/01/2015  . Adaptive colitis 02/01/2015  . Migraine without aura and responsive to treatment 02/01/2015  . 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 Pleural cavity effusion 02/01/2015  . Spontaneous pneumothorax 09/05/2014    No past medical history on file.  Social History   Social History  . Marital status: Single    Spouse name: N/A  . Number of children: N/A  . Years of education: H/S   Occupational History  . [employer]    Social History Main Topics  . Smoking status: Never Smoker  . Smokeless tobacco: Never Used  . Alcohol use No  . Drug use: No  . Sexual activity: Not on file   Other Topics Concern  . Not on file   Social History Narrative  . No narrative on file    Outpatient Encounter Prescriptions as of 07/28/2016  Medication Sig Note  . sertraline (ZOLOFT) 50 MG tablet  Take 1 tablet (50 mg total) by mouth daily.   . SUMAtriptan (IMITREX) 100 MG tablet Take by mouth. 02/01/2015: Medication taken as needed. Max 2/day [facility] from: [facility]   No facility-administered encounter medications on file as of 07/28/2016.     No Known Allergies  Review of Systems  Eyes: Negative.   Respiratory: Positive for shortness of breath (at random times off and on along with back pain).   Cardiovascular: Negative.   Gastrointestinal: Positive for nausea.  Musculoskeletal: Positive for back pain (comes and goes at random times).  Skin:       Feet discolored-purple color  Neurological: Positive for weakness (in the arms slightly).       Numbness in the feet chronic per patient.  Endo/Heme/Allergies: Negative.   Psychiatric/Behavioral: Negative.     Objective:  BP 102/64   Pulse 70   Temp 97.7 F (36.5 C)   Resp 14   SpO2 98%   Physical Exam  Constitutional: Tanner Brown is oriented to person, place, and time and well-developed, well-nourished, and in no distress.  HENT:  Head: Normocephalic and atraumatic.  Eyes: Conjunctivae are normal. Pupils are equal, round, and reactive to light.  Neck: Normal range of motion. Neck supple.  Cardiovascular: Normal rate, regular rhythm, normal heart sounds and intact distal pulses.  Exam reveals no gallop.   No murmur heard. Pulmonary/Chest: Effort normal and  breath sounds normal. No respiratory distress. Tanner Brown has no wheezes.  Abdominal: Soft. Bowel sounds are normal. Tanner Brown exhibits no distension. There is no tenderness. There is no rebound.  Musculoskeletal:  Pulses are normal. Toes are cold but pulses are good. Capillary refill is good.   Neurological: Tanner Brown is alert and oriented to person, place, and time. Gait normal. Coordination normal. GCS score is 15.  Skin: Skin is warm and dry.  Psychiatric: Mood, memory, affect and judgment normal.    Assessment and Plan :  1. Acute left-sided low back pain without  sciatica Acute. Will check labs-pending results. - CBC w/Diff/Platelet - Comprehensive metabolic panel  2. Other fatigue - CBC w/Diff/Platelet - TSH - Comprehensive metabolic panel  3. Shortness of breath Chest xray was normal in October 2017. Exam today is normal. Question etiology for discoloration of feet. Feet are normal during the exam today. Will follow.  Refer back to Dr. Evette Cristal from surgery. May need pulmonary referral. 4.Chronic Thoracic Back Pain Patient is tearful due to chronicity and significance of the pain. Further workup needed. Tanner Brown has failed chronic NSAID therapy  I have done the exam and reviewed the chart and it is accurate to the best of my knowledge. Dentist has been used and  any errors in dictation or transcription are unintentional. Julieanne Manson M.D. [facility]   HPI, Exam and A&P transcribed under direction and in the presence of Julieanne Manson, MD.

## 2016-07-29 LAB — COMPREHENSIVE METABOLIC PANEL
A/G RATIO: 2 (ref 1.2–2.2)
ALT: 64 IU/L — AB (ref 0–44)
AST: 38 IU/L (ref 0–40)
Albumin: 4.7 g/dL (ref 3.5–5.5)
Alkaline Phosphatase: 75 IU/L (ref 39–117)
BILIRUBIN TOTAL: 0.3 mg/dL (ref 0.0–1.2)
BUN/Creatinine Ratio: 14 (ref 9–20)
BUN: 14 mg/dL (ref 6–20)
CHLORIDE: 101 mmol/L (ref 96–106)
CO2: 26 mmol/L (ref 18–29)
Calcium: 9.4 mg/dL (ref 8.7–10.2)
Creatinine, Ser: 0.99 mg/dL (ref 0.76–1.27)
GFR calc Af Amer: 126 mL/min/{1.73_m2} (ref >59)
GFR calc non Af Amer: 109 mL/min/{1.73_m2} (ref >59)
GLOBULIN, TOTAL: 2.4 g/dL (ref 1.5–4.5)
Glucose: 86 mg/dL (ref 65–99)
POTASSIUM: 4.4 mmol/L (ref 3.5–5.2)
SODIUM: 140 mmol/L (ref 134–144)
TOTAL PROTEIN: 7.1 g/dL (ref 6.0–8.5)

## 2016-07-29 LAB — CBC WITH DIFFERENTIAL/PLATELET
Basophils Absolute: 0 10*3/uL (ref 0.0–0.2)
Basos: 1 %
EOS (ABSOLUTE): 0.1 10*3/uL (ref 0.0–0.4)
Eos: 2 %
HEMOGLOBIN: 14.3 g/dL (ref 13.0–17.7)
Hematocrit: 42.7 % (ref 37.5–51.0)
Immature Grans (Abs): 0 10*3/uL (ref 0.0–0.1)
Immature Granulocytes: 0 %
LYMPHS ABS: 1.9 10*3/uL (ref 0.7–3.1)
Lymphs: 30 %
MCH: 29.7 pg (ref 26.6–33.0)
MCHC: 33.5 g/dL (ref 31.5–35.7)
MCV: 89 fL (ref 79–97)
Monocytes Absolute: 0.8 10*3/uL (ref 0.1–0.9)
Monocytes: 12 %
NEUTROS ABS: 3.6 10*3/uL (ref 1.4–7.0)
Neutrophils: 55 %
PLATELETS: 218 10*3/uL (ref 150–379)
RBC: 4.82 x10E6/uL (ref 4.14–5.80)
RDW: 13.7 % (ref 12.3–15.4)
WBC: 6.4 10*3/uL (ref 3.4–10.8)

## 2016-07-29 LAB — TSH: TSH: 2.91 u[IU]/mL (ref 0.450–4.500)

## 2016-07-30 ENCOUNTER — Ambulatory Visit: Payer: 59 | Admitting: Family Medicine

## 2016-07-31 ENCOUNTER — Ambulatory Visit (INDEPENDENT_AMBULATORY_CARE_PROVIDER_SITE_OTHER): Payer: 59 | Admitting: General Surgery

## 2016-07-31 ENCOUNTER — Encounter: Payer: Self-pay | Admitting: General Surgery

## 2016-07-31 VITALS — BP 118/74 | HR 82 | Resp 12 | Ht 76.0 in | Wt 171.0 lb

## 2016-07-31 DIAGNOSIS — R0789 Other chest pain: Secondary | ICD-10-CM

## 2016-07-31 NOTE — Progress Notes (Signed)
Patient ID: Tanner Brown, male   DOB: 06/16/1996, 21 y.o.   MRN: [id number]  Chief Complaint  Patient presents with  . Back Pain    HPI Tanner Brown is a 21 y.o. male here for assessment of back pain. Back pain has been sporadic that has been getting worse over the past few weeks. States pain is worse on the left side, and is located under his shoulder blade. Additionally states that sometimes he cannot catch his breath with the pain. Restricts him from working out,has been unable to go to gym since pain has began. I have reviewed the history of present illness with the patient.   HPI  Past Medical History:  Diagnosis Date  . Collapsed lung 2017    Past Surgical History:  Procedure Laterality Date  . PLEURAL SCARIFICATION  2015    Family History  Problem Relation Age of Onset  . Depression Mother   . Depression Father     Social History Social History  Substance Use Topics  . Smoking status: Never Smoker  . Smokeless tobacco: Never Used  . Alcohol use No    No Known Allergies  Current Outpatient Prescriptions  Medication Sig Dispense Refill  . sertraline (ZOLOFT) 50 MG tablet Take 1 tablet (50 mg total) by mouth daily. 90 tablet 1  . SUMAtriptan (IMITREX) 100 MG tablet Take by mouth.     No current facility-administered medications for this visit.     Review of Systems Review of Systems  Constitutional: Negative.   Respiratory: Positive for shortness of breath.   Cardiovascular: Negative.     Blood pressure 118/74, pulse 82, resp. rate 12, height 6\' 4"  (1.93 m), weight 171 lb (77.6 kg).  Physical Exam Physical Exam  Constitutional: He is oriented to person, place, and time. He appears well-developed and well-nourished.  Eyes: Conjunctivae are normal. No scleral icterus.  Neck: Neck supple.  Cardiovascular: Normal rate, regular rhythm and normal heart sounds.   Pulmonary/Chest: Effort normal and breath sounds normal.  Musculoskeletal:        Cervical back: He exhibits no tenderness.       Thoracic back: He exhibits no tenderness.       Arms: Lymphadenopathy:    He has no cervical adenopathy.  Neurological: He is alert and oriented to person, place, and time.  Skin: Skin is warm and dry.    Data Reviewed Prior notes and chest XR from 04/2016 Assessment    No apparent cause for his pain. He is s/p left thoracoscopy with pleurodesis for pneumothorax    Plan    Order chest CT to determine source of pain.    The patient is scheduled for [facility]16 at 10:00 am. He will arrive there by 9:45 am and have only clear liquids for 4 hours prior.    Addeline Calarco G 07/31/2016, 12:31 PM

## 2016-07-31 NOTE — Patient Instructions (Signed)
The patient is scheduled for a CT of the Chest with contrast [facility] on 08/04/16 at 10:00 am. He will arrive there by 9:45 am and have only clear liquids for 4 hours prior.

## 2016-08-04 ENCOUNTER — Ambulatory Visit
Admission: RE | Admit: 2016-08-04 | Discharge: 2016-08-04 | Disposition: A | Discharge status: Home / Self Care | Payer: 59 | Source: Ambulatory Visit | Attending: General Surgery | Admitting: General Surgery

## 2016-08-04 DIAGNOSIS — R0789 Other chest pain: Secondary | ICD-10-CM | POA: Insufficient documentation

## 2016-08-04 DIAGNOSIS — Z9889 Other specified postprocedural states: Secondary | ICD-10-CM | POA: Insufficient documentation

## 2016-08-04 MED ORDER — IOPAMIDOL (ISOVUE-300) INJECTION 61%
75.0000 mL | Freq: Once | INTRAVENOUS | Status: AC | PRN
  Administered 2016-08-04: 75 mL via INTRAVENOUS

## 2016-08-12 DIAGNOSIS — M546 Pain in thoracic spine: Secondary | ICD-10-CM | POA: Diagnosis not present

## 2016-08-17 ENCOUNTER — Other Ambulatory Visit: Payer: Self-pay | Admitting: Orthopedic Surgery

## 2016-08-17 DIAGNOSIS — M546 Pain in thoracic spine: Secondary | ICD-10-CM

## 2016-08-24 ENCOUNTER — Ambulatory Visit: Payer: 59

## 2016-08-24 DIAGNOSIS — H0014 Chalazion left upper eyelid: Secondary | ICD-10-CM | POA: Diagnosis not present

## 2016-08-26 ENCOUNTER — Ambulatory Visit
Admission: RE | Admit: 2016-08-26 | Discharge: 2016-08-26 | Disposition: A | Discharge status: Home / Self Care | Payer: 59 | Source: Ambulatory Visit | Attending: Orthopedic Surgery | Admitting: Orthopedic Surgery

## 2016-08-26 DIAGNOSIS — R079 Chest pain, unspecified: Secondary | ICD-10-CM | POA: Diagnosis not present

## 2016-08-26 DIAGNOSIS — M546 Pain in thoracic spine: Secondary | ICD-10-CM

## 2016-08-26 MED ORDER — GADOBENATE DIMEGLUMINE 529 MG/ML IV SOLN
15.0000 mL | Freq: Once | INTRAVENOUS | Status: AC | PRN
  Administered 2016-08-26: 15 mL via INTRAVENOUS

## 2016-08-28 DIAGNOSIS — M546 Pain in thoracic spine: Secondary | ICD-10-CM | POA: Diagnosis not present

## 2016-11-09 ENCOUNTER — Other Ambulatory Visit: Payer: Self-pay | Admitting: *Deleted

## 2016-11-09 MED ORDER — SUMATRIPTAN SUCCINATE 100 MG PO TABS: ORAL_TABLET | ORAL | 1 refills | Status: DC

## 2017-01-27 ENCOUNTER — Telehealth: Payer: Self-pay | Admitting: Internal Medicine

## 2017-01-27 ENCOUNTER — Other Ambulatory Visit: Payer: Self-pay | Admitting: Emergency Medicine

## 2017-01-27 DIAGNOSIS — R079 Chest pain, unspecified: Secondary | ICD-10-CM

## 2017-01-27 DIAGNOSIS — J9383 Other pneumothorax: Secondary | ICD-10-CM

## 2017-01-27 NOTE — Telephone Encounter (Signed)
lmom tcb x1 Maralyn SagoSarah

## 2017-01-27 NOTE — Telephone Encounter (Signed)
lmtcb x1 for Tanner Brown. 

## 2017-01-27 NOTE — Addendum Note (Signed)
Addended by: Latanya Presser'DELL, BRITTANY M on: 01/27/2017 04:11 PM   Modules accepted: Orders

## 2017-01-27 NOTE — Telephone Encounter (Signed)
Tanner Brown returned phone call..ert

## 2017-01-28 NOTE — Telephone Encounter (Signed)
15 min is fine as likely he is healthy otherwise

## 2017-01-28 NOTE — Telephone Encounter (Signed)
Sarah returning call.Caren GriffinsStanley A Dalton

## 2017-01-28 NOTE — Telephone Encounter (Signed)
MW  Please Advise-  Maralyn SagoSarah called from Dr. Lynnell Grainichard Lindquist's office and Dr. Sullivan LoneGilbert wanted to know if you could see his son in next week due to Spontaneous pneumothorax. This patient has never been seen by our office. You currently do not have any open 30 min appt slots next week

## 2017-01-28 NOTE — Telephone Encounter (Signed)
lmtcb x2 for Sarah. 

## 2017-01-28 NOTE — Telephone Encounter (Signed)
Pt has been scheduled for consult with MW for 02/04/17 at 4:00. Sarah with Dr. Elisabeth CaraGilbert's office is aware  Lm with pt's brother to have pt return our call to make aware of apt time/date.

## 2017-02-04 ENCOUNTER — Ambulatory Visit (INDEPENDENT_AMBULATORY_CARE_PROVIDER_SITE_OTHER): Payer: 59 | Admitting: Internal Medicine

## 2017-02-04 ENCOUNTER — Encounter: Payer: Self-pay | Admitting: Internal Medicine

## 2017-02-04 VITALS — BP 106/70 | HR 74 | Ht 76.0 in | Wt 175.0 lb

## 2017-02-04 DIAGNOSIS — J90 Pleural effusion, not elsewhere classified: Secondary | ICD-10-CM

## 2017-02-04 DIAGNOSIS — G8922 Chronic post-thoracotomy pain: Secondary | ICD-10-CM

## 2017-02-04 DIAGNOSIS — R0609 Other forms of dyspnea: Secondary | ICD-10-CM | POA: Diagnosis not present

## 2017-02-04 NOTE — Patient Instructions (Signed)
Zostrix cream 4 x daily x at least two weeks only in the area of greatest pain.   It will  Burn initially so use just  a small amount at first >  as improve cut down to bedtime dose only   Advil up to 2 -4 with meals as needed when discomfort is severe but should not need this regularly   Schedule pfts next available

## 2017-02-04 NOTE — Progress Notes (Signed)
Subjective:     Patient ID: Tanner Brown, male   DOB: 30-Aug-1995,    MRN: [id number]  HPI  63 yowm never smoker  Previously evaluated by Dr Raul Del 09/05/14 with following note  recent right pneumotorax followed one month later by left spontaneuos pneumothorax. He recalls that prior to the first pneumothorax he was lifting weight. He denies cigarettes (never smoked), crack, constipation or preceding violent coughing. The right pneumothorax was treated by having undergo right pleurodesis vis thorascopy (7/15). The left was initially treated with chemical pleurodesis (faoiled). Hence had the pleurodesis completed via thorascopy. Of note he also was having low grade temperatures during this period and even after the last event. He is having low grade temps now. He denies rashes, nodes, bone pain, frequent urination, worsening shortness of breath. No exposure to known TB, recent foreign travel, nonhealing lesions, dysuria or loose stools. 2014 he was seen in the ER for abdominal pain. Today he did not volunteer a history of cramping, bloating or fatty stools. Basically the work up was negative    02/04/2017 1st [facility] office visit/ Wert   Chief Complaint  Patient presents with  . Pulmonary Consult    Referred by Dr. Rosanna Randy. Pt states has hx of spontaneous pneumothorax.  He c/o painful breathing and SOB.    Pain onset was p pleurodesis with doxycline and very localized to L post base 2/10 with deep breath, 4/10 with lifting but not until he wakes up  the next day  Resolves with prednisone >  advil - never had ESR per Dr Rosanna Randy  Doe x jogging  No obvious day to day or daytime variability or assoc excess/ purulent sputum or mucus plugs or hemoptysis or   chest tightness, subjective wheeze or overt sinus or hb symptoms. No unusual exp hx or h/o childhood pna/ asthma or knowledge of premature birth.  Sleeping ok without nocturnal  or early am exacerbation  of respiratory  c/o's or need for  noct saba. Also denies any obvious fluctuation of symptoms with weather or environmental changes or other aggravating or alleviating factors except as outlined above   Current Medications, Allergies, Complete Past Medical History, Past Surgical History, Family History, and Social History were reviewed in [employer] record.  ROS  The following are not active complaints unless bolded sore throat, dysphagia, dental problems, itching, sneezing,  nasal congestion or excess/ purulent secretions, ear ache,   fever, chills, sweats, unintended wt loss, classically  exertional cp,  orthopnea pnd or leg swelling, presyncope, palpitations, abdominal pain, anorexia, nausea, vomiting, diarrhea  or change in bowel or bladder habits, change in stools or urine, dysuria,hematuria,  rash, arthralgias, visual complaints, headache, numbness, weakness or ataxia or problems with walking or coordination,  change in mood/affect or memory.                Review of Systems     Objective:   Physical Exam    very healthy appearing amb wm nad  Wt Readings from Last 3 Encounters:  02/04/17 175 lb (79.4 kg)  07/31/16 171 lb (77.6 kg)  04/20/16 171 lb (77.6 kg)    Vital signs reviewed   HEENT: nl dentition, turbinates bilaterally, and oropharynx. Nl external ear canals without cough reflex   NECK :  without JVD/Nodes/TM/ nl carotid upstrokes bilaterally   LUNGS: no acc muscle use,  Nl contour chest which is clear to A and P bilaterally without cough on insp or exp maneuvers  CV:  RRR  no s3 or murmur or increase in P2, and no edema   ABD:  soft and nontender with nl inspiratory excursion in the supine position. No bruits or organomegaly appreciated, bowel sounds nl  MS:  Nl gait/ ext warm without deformities, calf tenderness, cyanosis or clubbing No obvious joint restrictions   SKIN: warm and dry without lesions    NEURO:  alert, approp, nl sensorium with  no motor or  cerebellar deficits apparent.     I personally reviewed images and agree with radiology impression as follows:   Chest CT w contrast 08/04/16 1. No infiltrate or pulmonary edema. 2. Stable postsurgical changes bilateral upper lobe. 3. No mediastinal hematoma or adenopathy. 4. No destructive bony lesions are noted.  No acute fractures.  MR Chest wall/ pleura 08/26/16  whl     Assessment:

## 2017-02-05 ENCOUNTER — Encounter: Payer: Self-pay | Admitting: Internal Medicine

## 2017-02-05 DIAGNOSIS — R0609 Other forms of dyspnea: Secondary | ICD-10-CM | POA: Insufficient documentation

## 2017-02-05 DIAGNOSIS — G8922 Chronic post-thoracotomy pain: Secondary | ICD-10-CM | POA: Insufficient documentation

## 2017-02-05 NOTE — Assessment & Plan Note (Signed)
S/p doxy pleurodesis via thoracotomy in Oct 2015  - trial of zostrix rec 02/04/2017   The good news is that this pain is very localized and improves with pred > nsaids but unfortunately coming up on 3 years since procedure it is unlikely to resolve completely and he'll always feel some discomfort with deep breathing as the pleural surfaces don't slide over each other smoothly any  More as they are likley mostly adhesed, though perhaps not uniformly so.  He has not tried any conservative measures like more freq use of nsaids or zostrix and should probably do so before considering other options like nerve block or gabapentin trial   Total time devoted to counseling  > 50 % of initial 40 min office visit:  review case with pt/ mom and dad, discussion of options/alternatives/ personally creating written customized instructions  in presence of pt  then going over those specific  Instructions directly with the pt including how to use all of the meds but in particular covering each new medication in detail and the difference between the maintenance= "automatic" meds and the prns using an action plan format for the latter (If this problem/symptom => do that organization reading Left to right).  Please see AVS from this visit for a full list of these instructions which I personally wrote for this pt and  are unique to this visit.

## 2017-02-05 NOTE — Assessment & Plan Note (Signed)
Most likely from deconditioning due to avoiding aerobics due to pleuritic L cp sp pleurodesis but hopefully with control of the pain he can resume training   In meantime pfts ordered to be complete.

## 2017-02-05 NOTE — Assessment & Plan Note (Signed)
No significant residual effusion at this point by most recent CT

## 2017-02-09 ENCOUNTER — Ambulatory Visit: Payer: 59

## 2017-02-09 DIAGNOSIS — J90 Pleural effusion, not elsewhere classified: Secondary | ICD-10-CM

## 2017-02-09 LAB — PULMONARY FUNCTION TEST
DL/VA % pred: 97 %
DL/VA: 4.77 ml/min/mmHg/L
DLCO COR % PRED: 93 %
DLCO COR: 34.78 ml/min/mmHg
DLCO unc % pred: 93 %
DLCO unc: 35.07 ml/min/mmHg
FEF 25-75 PRE: 3.45 L/s
FEF 25-75 Post: 5.1 L/sec
FEF2575-%CHANGE-POST: 47 %
FEF2575-%PRED-POST: 96 %
FEF2575-%PRED-PRE: 65 %
FEV1-%CHANGE-POST: 16 %
FEV1-%Pred-Post: 97 %
FEV1-%Pred-Pre: 83 %
FEV1-Post: 5.01 L
FEV1-Pre: 4.31 L
FEV1FVC-%Change-Post: 13 %
FEV1FVC-%PRED-PRE: 87 %
FEV6-%CHANGE-POST: 2 %
FEV6-%Pred-Post: 96 %
FEV6-%Pred-Pre: 93 %
FEV6-Post: 5.97 L
FEV6-Pre: 5.84 L
FEV6FVC-%Pred-Post: 100 %
FEV6FVC-%Pred-Pre: 100 %
FVC-%Change-Post: 2 %
FVC-%Pred-Post: 95 %
FVC-%Pred-Pre: 93 %
FVC-Post: 5.97 L
FVC-Pre: 5.84 L
POST FEV1/FVC RATIO: 84 %
Post FEV6/FVC ratio: 100 %
Pre FEV1/FVC ratio: 74 %
Pre FEV6/FVC Ratio: 100 %
RV % pred: 138 %
RV: 2.27 L
TLC % pred: 102 %
TLC: 7.8 L

## 2017-02-10 ENCOUNTER — Other Ambulatory Visit: Payer: Self-pay | Admitting: Internal Medicine

## 2017-02-10 MED ORDER — BUDESONIDE-FORMOTEROL FUMARATE 80-4.5 MCG/ACT IN AERO: 2.0000 | INHALATION_SPRAY | Freq: Two times a day (BID) | RESPIRATORY_TRACT | 0 refills | Status: DC

## 2017-02-10 NOTE — Progress Notes (Signed)
Spoke with pt and notified of results per Dr. Wert. Pt verbalized understanding and denied any questions. Rx sent to pharm

## 2017-02-24 ENCOUNTER — Ambulatory Visit
Admission: RE | Admit: 2017-02-24 | Discharge: 2017-02-24 | Disposition: A | Discharge status: Home / Self Care | Payer: 59 | Source: Ambulatory Visit | Attending: Family Medicine | Admitting: Family Medicine

## 2017-02-24 ENCOUNTER — Ambulatory Visit (INDEPENDENT_AMBULATORY_CARE_PROVIDER_SITE_OTHER): Payer: 59 | Admitting: Family Medicine

## 2017-02-24 ENCOUNTER — Encounter: Payer: Self-pay | Admitting: Family Medicine

## 2017-02-24 VITALS — BP 108/68 | HR 73 | Temp 97.6°F | Resp 18 | Wt 175.0 lb

## 2017-02-24 DIAGNOSIS — R0602 Shortness of breath: Secondary | ICD-10-CM

## 2017-02-24 DIAGNOSIS — R079 Chest pain, unspecified: Secondary | ICD-10-CM | POA: Diagnosis not present

## 2017-02-24 DIAGNOSIS — R05 Cough: Secondary | ICD-10-CM | POA: Insufficient documentation

## 2017-02-24 DIAGNOSIS — R059 Cough, unspecified: Secondary | ICD-10-CM

## 2017-02-24 MED ORDER — AZITHROMYCIN 250 MG PO TABS: ORAL_TABLET | ORAL | 0 refills | Status: AC

## 2017-02-24 NOTE — Progress Notes (Signed)
       Patient: Tanner Brown Male    DOB: 08/01/1995   20 y.o.   MRN: [id number] Visit Date: 02/24/2017  Today's Provider: Mila Merryonald Dameian Crisman, MD   Chief Complaint  Patient presents with  . URI   Subjective:    URI   This is a new problem. Episode onset: 9 days ago. The problem has been gradually worsening. Associated symptoms include chest pain, congestion, coughing (productive), headaches, rhinorrhea, sneezing and a sore throat (resolved). Pertinent negatives include no nausea, sinus pain, vomiting or wheezing. Treatments tried: Ibuprofen, OTC Cold medication. The treatment provided mild relief.  Began as cold symptoms with sore throat, nasal and sinus congestion for a few days. Developed cough and fever 101-102 one week ago. Sore throat and nasal congestion have mostly resolved. Had low grade fever around 99 yesterday, but still feels more short of breath than usual. Cough occasionally productive of clear mucous.      No Known Allergies   Current Outpatient Prescriptions:  .  budesonide-formoterol (SYMBICORT) 80-4.5 MCG/ACT inhaler, Inhale 2 puffs into the lungs 2 (two) times daily., Disp: 1 Inhaler, Rfl: 0  Review of Systems  HENT: Positive for congestion, rhinorrhea, sneezing and sore throat (resolved). Negative for sinus pain.   Respiratory: Positive for cough (productive) and shortness of breath. Negative for wheezing.   Cardiovascular: Positive for chest pain.  Gastrointestinal: Negative for nausea and vomiting.  Neurological: Positive for headaches.    Social History  Substance Use Topics  . Smoking status: Never Smoker  . Smokeless tobacco: Never Used  . Alcohol use No   Objective:   BP 108/68 (BP Location: Left Arm, Patient Position: Sitting, Cuff Size: Normal)   Pulse 73   Temp 97.6 F (36.4 C) (Oral)   Resp 18   Wt 175 lb (79.4 kg)   SpO2 96% Comment: room air  BMI 21.30 kg/m  Vitals:   02/24/17 1340  BP: 108/68  Pulse: 73  Resp: 18  Temp: 97.6 F  (36.4 C)  TempSrc: Oral  SpO2: 96%  Weight: 175 lb (79.4 kg)     Physical Exam  General Appearance:    Alert, cooperative, no distress  HENT:   bilateral TM normal without fluid or infection, neck without nodes, sinuses nontender and nasal mucosa congested  Eyes:    PERRL, conjunctiva/corneas clear, EOM's intact       Lungs:     End inspiratory crackles right upper lung fields. No wheezing. Even and unlabored.  Heart:    Regular rate and rhythm  Neurologic:   Awake, alert, oriented x 3. No apparent focal neurological           defect.      Chest XR - No acute process.      Assessment & Plan:     1. Cough Persistent for last week.  - DG Chest 2 View; Future  2. Shortness of breath  - DG Chest 2 View; Future  Viral URI which is now improving. Considering his pulmonary history will give option to start azithromycin if not continuing to steadily improve.        Mila Merryonald Jaeden Westbay, MD  [facility]

## 2017-07-22 ENCOUNTER — Encounter: Payer: Self-pay | Admitting: Family Medicine

## 2017-07-22 ENCOUNTER — Ambulatory Visit (INDEPENDENT_AMBULATORY_CARE_PROVIDER_SITE_OTHER): Payer: 59 | Admitting: Family Medicine

## 2017-07-22 VITALS — BP 100/68 | HR 68 | Temp 97.6°F | Resp 16 | Wt 182.0 lb

## 2017-07-22 DIAGNOSIS — Z23 Encounter for immunization: Secondary | ICD-10-CM | POA: Diagnosis not present

## 2017-07-22 DIAGNOSIS — R1084 Generalized abdominal pain: Secondary | ICD-10-CM | POA: Diagnosis not present

## 2017-07-22 NOTE — Progress Notes (Signed)
Patient: Tanner Brown Male    DOB: Feb 10, 1996   22 y.o.   MRN: [id number] Visit Date: 07/22/2017  Today's Provider: Mila Merry, MD   Chief Complaint  Patient presents with  . Abdominal Pain   Subjective:    Patient has been having abdominal pain for around 6 months. Pain located in upper abdomin. Pain is described as an aching, crampy feeling. Patient states usually when abdominal pain occurs, he will have nausea and diarrhea. Abdominal pain occurs every few days.     Abdominal Pain  This is a new problem. The current episode started more than 1 month ago (6 months). The onset quality is gradual. The problem occurs every several days. The problem has been unchanged. The pain is located in the generalized abdominal region. The pain is at a severity of 2/10. The pain is mild. The quality of the pain is aching and cramping. The abdominal pain does not radiate. Associated symptoms include anorexia, arthralgias, diarrhea, nausea and vomiting. Pertinent negatives include no belching, constipation, dysuria, fever, flatus, frequency, headaches, hematochezia, melena or myalgias. Nothing aggravates the pain. The pain is relieved by liquids. He has tried nothing for the symptoms. There is no history of GERD.   Started having symptoms before starting school in the fall, but has been getting worse since October. Appetite is usually normal, bu worse when stomach is bothering him. Symptoms sometimes start as soon as he gets up in the morning and can last anywhere from 20 minutes to two days. Occurs about one a week. Sometimes associated with diarrhea, no change in stool color, no blood in stool. No constipation. States he stays pretty anxious during the school year, but not stressed at all since being home for winter break, but still having stomach pains. Has taken some ibuprofen which didn't have any effect, has not tried any anti-acid medications. Denies dietary change and does not drink alcohol  at all.     Wt Readings from Last 3 Encounters:  07/22/17 182 lb (82.6 kg)  02/24/17 175 lb (79.4 kg)  02/04/17 175 lb (79.4 kg)       No Known Allergies   Current Outpatient Medications:  .  budesonide-formoterol (SYMBICORT) 80-4.5 MCG/ACT inhaler, Inhale 2 puffs into the lungs 2 (two) times daily. (Patient not taking: Reported on 07/22/2017), Disp: 1 Inhaler, Rfl: 0 .  SUMAtriptan (IMITREX) 100 MG tablet, Take by mouth., Disp: , Rfl:   Review of Systems  Constitutional: Negative for appetite change, chills and fever.  Respiratory: Negative for chest tightness, shortness of breath and wheezing.   Cardiovascular: Negative for chest pain and palpitations.  Gastrointestinal: Positive for anorexia, diarrhea, nausea and vomiting. Negative for abdominal pain, constipation, flatus, hematochezia and melena.  Genitourinary: Negative for dysuria and frequency.  Musculoskeletal: Positive for arthralgias. Negative for myalgias.  Neurological: Negative for headaches.    Social History   Tobacco Use  . Smoking status: Never Smoker  . Smokeless tobacco: Never Used  Substance Use Topics  . Alcohol use: No   Objective:   BP 100/68 (BP Location: Right Arm, Patient Position: Sitting, Cuff Size: Normal)   Pulse 68   Temp 97.6 F (36.4 C) (Oral)   Resp 16   Wt 182 lb (82.6 kg)   SpO2 94%   BMI 22.15 kg/m  Vitals:   07/22/17 0940  BP: 100/68  Pulse: 68  Resp: 16  Temp: 97.6 F (36.4 C)  TempSrc: Oral  SpO2: 94%  Weight:  182 lb (82.6 kg)     Physical Exam  General Appearance:    Alert, cooperative, no distress  Eyes:    PERRL, conjunctiva/corneas clear, EOM's intact       Lungs:     Clear to auscultation bilaterally, respirations unlabored  Heart:    Regular rate and rhythm  Abdomen:   bowel sounds present and normal in all 4 quadrants, soft, round, nontender or nondistended. No CVA tenderness        Assessment & Plan:     1. Generalized abdominal pain with  diarrhea Non-specific symptoms intermittent x about 6 months.   - H. pylori breath test - CBC - Comprehensive metabolic panel - Amylase  Consider ultrasound if labs negative.  Consider IBS.   2. Need for influenza vaccination  - Flu Vaccine QUAD 36+ mos IM       Mila Merryonald Tammera Engert, MD  [facility]

## 2017-07-22 NOTE — Progress Notes (Deleted)
Patient: Tanner Brown, Male    DOB: 1995/11/22, 22 y.o.   MRN: [id number] Visit Date: 07/22/2017  Today's Provider: Lelon Huh, MD   No chief complaint on file.  Subjective:    Annual physical exam Tanner Brown is a 22 y.o. male who presents today for health maintenance and complete physical. He feels {DESC; WELL/FAIRLY WELL/POORLY:18703}. He reports exercising ***. He reports he is sleeping {DESC; WELL/FAIRLY WELL/POORLY:18703}.  -----------------------------------------------------------------  ADD (attention deficit disorder) Not on any medications at this time.  Review of Systems  Social History      He  reports that  has never smoked. he has never used smokeless tobacco. He reports that he does not drink alcohol or use drugs.       Social History   Socioeconomic History  . Marital status: Single    Spouse name: Not on file  . Number of children: Not on file  . Years of education: H/S  . Highest education level: Not on file  Social Needs  . Financial resource strain: Not on file  . Food insecurity - worry: Not on file  . Food insecurity - inability: Not on file  . Transportation needs - medical: Not on file  . Transportation needs - non-medical: Not on file  Occupational History  . Occupation: [employer]  Tobacco Use  . Smoking status: Never Smoker  . Smokeless tobacco: Never Used  Substance and Sexual Activity  . Alcohol use: No  . Drug use: No  . Sexual activity: Not on file  Other Topics Concern  . Not on file  Social History Narrative  . Not on file    Past Medical History:  Diagnosis Date  . Collapsed lung 2017     Patient Active Problem List   Diagnosis Date Noted  . Chronic post-thoracotomy pain 02/05/2017  . Dyspnea on exertion 02/05/2017  . Accident 02/01/2015  . ADD (attention deficit disorder) 02/01/2015  . Chest pain 02/01/2015  . Closed fracture of distal end of radius 02/01/2015  . Adaptive colitis 02/01/2015  .  Migraine without aura and responsive to treatment 02/01/2015  . 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 Pleural cavity effusion 02/01/2015  . Spontaneous pneumothorax 09/05/2014    Past Surgical History:  Procedure Laterality Date  . PLEURAL SCARIFICATION  2015    Family History        Family Status  Relation Name Status  . Mother  Alive  . Father  Alive  . Brother  Alive  . Brother  Alive        His family history includes Depression in his father and mother.     No Known Allergies   Current Outpatient Medications:  .  budesonide-formoterol (SYMBICORT) 80-4.5 MCG/ACT inhaler, Inhale 2 puffs into the lungs 2 (two) times daily., Disp: 1 Inhaler, Rfl: 0   Patient Care Team: Birdie Sons, MD as PCP - General (Family Medicine) Christene Lye, MD (General Surgery) Jerrol Banana., MD (Family Medicine)      Objective:   Vitals: There were no vitals taken for this visit.  There were no vitals filed for this visit.   Physical Exam   Depression Screen No flowsheet data found.    Assessment & Plan:     Routine Health Maintenance and Physical Exam  Exercise Activities and Dietary recommendations Goals    None      Immunization History  Administered Date(s) Administered  . DTaP 05/23/1996,  07/26/1996, 10/05/1996, 08/17/1997, 03/11/2001  . Hepatitis A 05/25/2006  . Hepatitis B Jan [birth year] 1996, 05/23/1996, 10/05/1996  . HiB (PRP-OMP) 05/23/1996, 10/05/1996, 08/17/1997  . IPV 05/23/1996, 07/26/1996, 10/05/1996, 03/11/2001  . MMR 06/16/1998, 03/11/2001  . Meningococcal Conjugate 03/23/2012  . Tdap 03/30/2008  . Varicella 08/17/1997, 05/25/2006    Health Maintenance  Topic Date Due  . HIV Screening  03/22/2011  . INFLUENZA VACCINE  02/10/2017  . TETANUS/TDAP  03/30/2018     Discussed health benefits of physical activity, and encouraged him to engage in regular exercise appropriate for his age and condition.      --------------------------------------------------------------------    Lelon Huh, MD  [place]

## 2017-07-23 ENCOUNTER — Other Ambulatory Visit: Payer: Self-pay | Admitting: Family Medicine

## 2017-07-23 DIAGNOSIS — R1010 Upper abdominal pain, unspecified: Secondary | ICD-10-CM

## 2017-07-23 LAB — CBC
Hematocrit: 45.1 % (ref 37.5–51.0)
Hemoglobin: 14.6 g/dL (ref 13.0–17.7)
MCH: 29.4 pg (ref 26.6–33.0)
MCHC: 32.4 g/dL (ref 31.5–35.7)
MCV: 91 fL (ref 79–97)
PLATELETS: 248 10*3/uL (ref 150–379)
RBC: 4.97 x10E6/uL (ref 4.14–5.80)
RDW: 14.1 % (ref 12.3–15.4)
WBC: 5.9 10*3/uL (ref 3.4–10.8)

## 2017-07-23 LAB — COMPREHENSIVE METABOLIC PANEL
A/G RATIO: 1.7 (ref 1.2–2.2)
ALT: 35 IU/L (ref 0–44)
AST: 24 IU/L (ref 0–40)
Albumin: 4.7 g/dL (ref 3.5–5.5)
Alkaline Phosphatase: 74 IU/L (ref 39–117)
BILIRUBIN TOTAL: 0.3 mg/dL (ref 0.0–1.2)
BUN/Creatinine Ratio: 14 (ref 9–20)
BUN: 14 mg/dL (ref 6–20)
CHLORIDE: 102 mmol/L (ref 96–106)
CO2: 26 mmol/L (ref 20–29)
Calcium: 9.9 mg/dL (ref 8.7–10.2)
Creatinine, Ser: 0.99 mg/dL (ref 0.76–1.27)
GFR calc Af Amer: 125 mL/min/{1.73_m2} (ref >59)
GFR calc non Af Amer: 108 mL/min/{1.73_m2} (ref >59)
GLUCOSE: 91 mg/dL (ref 65–99)
Globulin, Total: 2.8 g/dL (ref 1.5–4.5)
POTASSIUM: 4.5 mmol/L (ref 3.5–5.2)
Sodium: 141 mmol/L (ref 134–144)
Total Protein: 7.5 g/dL (ref 6.0–8.5)

## 2017-07-23 LAB — AMYLASE: AMYLASE: 47 U/L (ref 31–124)

## 2017-07-23 LAB — H. PYLORI BREATH TEST: H pylori Breath Test: NEGATIVE

## 2017-07-26 ENCOUNTER — Telehealth: Payer: Self-pay

## 2017-07-26 NOTE — Telephone Encounter (Signed)
-----   Message from Malva Limesonald E Fisher, MD sent at 07/25/2017  8:14 PM EST ----- Need to schedule follow up o.v. Day after ultrasound is done.

## 2017-07-26 NOTE — Telephone Encounter (Signed)
LMTCB  Thanks,  -Joseline 

## 2017-07-26 NOTE — Telephone Encounter (Signed)
Patient advised as directed below.  Thanks,  -Joseline 

## 2017-07-27 ENCOUNTER — Ambulatory Visit
Admission: RE | Admit: 2017-07-27 | Discharge: 2017-07-27 | Disposition: A | Discharge status: Home / Self Care | Payer: 59 | Source: Ambulatory Visit | Attending: Family Medicine | Admitting: Family Medicine

## 2017-07-27 ENCOUNTER — Other Ambulatory Visit: Payer: Self-pay | Admitting: Family Medicine

## 2017-07-27 DIAGNOSIS — R1010 Upper abdominal pain, unspecified: Secondary | ICD-10-CM | POA: Diagnosis not present

## 2017-07-27 DIAGNOSIS — R109 Unspecified abdominal pain: Secondary | ICD-10-CM | POA: Diagnosis not present

## 2017-07-27 MED ORDER — OMEPRAZOLE 20 MG PO CPDR: 20.0000 mg | DELAYED_RELEASE_CAPSULE | Freq: Every day | ORAL | 2 refills | Status: DC

## 2017-07-27 MED ORDER — DICYCLOMINE HCL 20 MG PO TABS: 20.0000 mg | ORAL_TABLET | Freq: Four times a day (QID) | ORAL | 2 refills | Status: DC | PRN

## 2017-09-27 ENCOUNTER — Emergency Department: Payer: 59

## 2017-09-27 ENCOUNTER — Emergency Department
Admission: EM | Admit: 2017-09-27 | Discharge: 2017-09-27 | Disposition: A | Discharge status: Home / Self Care | Payer: 59 | Attending: Emergency Medicine | Admitting: Emergency Medicine

## 2017-09-27 DIAGNOSIS — R0603 Acute respiratory distress: Secondary | ICD-10-CM | POA: Insufficient documentation

## 2017-09-27 DIAGNOSIS — R197 Diarrhea, unspecified: Secondary | ICD-10-CM | POA: Insufficient documentation

## 2017-09-27 DIAGNOSIS — B349 Viral infection, unspecified: Secondary | ICD-10-CM | POA: Insufficient documentation

## 2017-09-27 DIAGNOSIS — R110 Nausea: Secondary | ICD-10-CM | POA: Insufficient documentation

## 2017-09-27 DIAGNOSIS — J9819 Other pulmonary collapse: Secondary | ICD-10-CM | POA: Diagnosis not present

## 2017-09-27 DIAGNOSIS — R079 Chest pain, unspecified: Secondary | ICD-10-CM | POA: Diagnosis not present

## 2017-09-27 LAB — COMPREHENSIVE METABOLIC PANEL
ALT: 31 U/L (ref 0–55)
AST (SGOT): 19 U/L (ref 5–34)
Albumin/Globulin Ratio: 1.5 (ref 0.9–2.2)
Albumin: 4.8 g/dL (ref 3.5–5.0)
Alkaline Phosphatase: 79 U/L (ref 38–106)
Anion Gap: 11 (ref 5.0–15.0)
BUN: 11.6 mg/dL (ref 9.0–28.0)
Bilirubin, Total: 0.4 mg/dL (ref 0.2–1.2)
CO2: 25 mEq/L (ref 22–29)
Calcium: 10 mg/dL (ref 8.5–10.5)
Chloride: 103 mEq/L (ref 100–111)
Creatinine: 1.1 mg/dL (ref 0.7–1.3)
Globulin: 3.1 g/dL (ref 2.0–3.6)
Glucose: 107 mg/dL — ABNORMAL HIGH (ref 70–100)
Potassium: 4.1 mEq/L (ref 3.5–5.1)
Protein, Total: 7.9 g/dL (ref 6.0–8.3)
Sodium: 139 mEq/L (ref 136–145)

## 2017-09-27 LAB — CBC AND DIFFERENTIAL
Absolute NRBC: 0 10*3/uL (ref 0.00–0.00)
Basophils Absolute Automated: 0.05 10*3/uL (ref 0.00–0.08)
Basophils Automated: 0.3 %
Eosinophils Absolute Automated: 0.02 10*3/uL (ref 0.00–0.44)
Eosinophils Automated: 0.1 %
Hematocrit: 45.8 % (ref 37.6–49.6)
Hgb: 15.3 g/dL (ref 12.5–17.1)
Immature Granulocytes Absolute: 0.05 10*3/uL (ref 0.00–0.07)
Immature Granulocytes: 0.3 %
Lymphocytes Absolute Automated: 1.16 10*3/uL (ref 0.42–3.22)
Lymphocytes Automated: 7.1 %
MCH: 29.4 pg (ref 25.1–33.5)
MCHC: 33.4 g/dL (ref 31.5–35.8)
MCV: 87.9 fL (ref 78.0–96.0)
MPV: 9.8 fL (ref 8.9–12.5)
Monocytes Absolute Automated: 1.34 10*3/uL — ABNORMAL HIGH (ref 0.21–0.85)
Monocytes: 8.2 %
Neutrophils Absolute: 13.75 10*3/uL — ABNORMAL HIGH (ref 1.10–6.33)
Neutrophils: 84 %
Nucleated RBC: 0 /100 WBC (ref 0.0–0.0)
Platelets: 224 10*3/uL (ref 142–346)
RBC: 5.21 10*6/uL (ref 4.20–5.90)
RDW: 12 % (ref 11–15)
WBC: 16.37 10*3/uL — ABNORMAL HIGH (ref 3.10–9.50)

## 2017-09-27 LAB — GROUP A STREP, RAPID ANTIGEN: Group A Strep, Rapid Antigen: NEGATIVE

## 2017-09-27 LAB — I-STAT TROPONIN: i-STAT Troponin: 0.01 ng/mL (ref 0.00–0.09)

## 2017-09-27 LAB — GFR: EGFR: >60

## 2017-09-27 LAB — IHS D-DIMER: D-Dimer: <0.27 ug/mL FEU (ref 0.00–0.50)

## 2017-09-27 MED ORDER — SODIUM CHLORIDE 0.9 % IV BOLUS
1000.00 mL | Freq: Once | INTRAVENOUS | Status: AC
Start: 2017-09-27 — End: 2017-09-27
  Administered 2017-09-27: 01:00:00 1000 mL via INTRAVENOUS

## 2017-09-27 NOTE — ED Triage Notes (Addendum)
Patient with dyspnea, back pain and migraine. Also reported nausea, vomiting and diarrhea earlier PTA. History of lung collapse, lung surgery and chest tubes.

## 2017-09-27 NOTE — Discharge Instructions (Signed)
Dear {DCIECCNAMES:[id number]}:    Thank you for choosing one of [facility]'s freestanding emergency departments.  I hope your visit today was EXCELLENT.    Specific instructions for your visit today:      Viral Syndrome    You have been diagnosed with a viral syndrome. This is also known as a "cold."    Some symptoms of a viral syndrome are fever (temperature higher than 100.4F / 38C) and headache. There may be stuffy nose, nasal congestion and sore throat. Cough and muscle aches are other symptoms. Sometimes there is a rash. Some people may also have nausea (sick to the stomach). Some have vomiting (throwing up) and/or diarrhea (loose stool).    Unfortunately, there is still no cure for the common cold! Antibiotics DO NOT work for viral illnesses. They may cause side-effects like rash or diarrhea (loose stool). Also, when antibiotics are used when not needed, they may not work for future bacterial infections. Most patients get better with simple things like rest, fluids and a little time. Acetaminophen (Tylenol) and/or ibuprofen (Advil or Motrin) can also help.    NEVER take ASPIRIN products with a viral infection. Aspirin may cause liver failure with certain viral infections.    Routine follow-up with your primary care doctor is recommended.    YOU SHOULD SEEK MEDICAL ATTENTION IMMEDIATELY, EITHER HERE OR AT THE NEAREST EMERGENCY DEPARTMENT, IF ANY OF THE FOLLOWING OCCURS:   Severe headache or stiff neck.   Uncontrolled vomiting.   Lightheadedness, feeling faint, or passing out.   Trouble breathing or shortness of breath.   Weakness or not able to walk.   Any other concerning symptoms or concerns.   You don't get better over 7 days or feel worse at any time.      If you do not continue to improve or your condition worsens, please contact your doctor or return immediately to the Emergency Department.    Sincerely,  Inesha Sow Mohammad,*MD  Attending Emergency Physician  [facility] Emergency Department    OBTAINING A PRIMARY CARE APPOINTMENT    Primary care physicians (PCPs, also known as primary care doctors) are either internists or family medicine doctors. Both types of PCPs focus on health promotion, disease prevention, patient education and counseling, and treatment of acute and chronic medical conditions.    Call for an appointment with a primary care doctor.  Ask to see who is taking new patients.     [facility]  telephone:  [phone]  [email or web address]    For a pediatrician, call the [facility] referral line below.  You can also call to make an appointment at [facility] (except [employer] and [employer]):    [address]  [place], [place] [address]  [phone]    DOCTOR REFERRALS  Call [phone] (available 24 hours a day, 7 days a week) if you need any further referrals and we can help you find a primary care doctor or specialist.  Also, available online at:  [email or web address]    For more information regarding our services at [facility], please call the number above or visit the website [email or web address]    YOUR CONTACT INFORMATION  Before leaving please check with registration to make sure we have an up-to-date contact number.  You can call registration at [phone], Option 7 ([place] location) or [phone], Option 1 ([place] location) to update your information.  For questions about your hospital bill, please call [phone].    For questions about your Emergency Dept Physician bill please call [phone].      FREE HEALTH SERVICES  If you need help with health or social services, please call 2-1-1 for a free referral to resources in your area.  2-1-1 is a free service connecting people with information on health insurance, free clinics, pregnancy, mental health, dental care, food assistance, housing, and substance abuse counseling.  Also, available online at:   http://www.[facility].org    MEDICAL RECORDS AND TESTS  Certain laboratory test results do not come back the same day, for example urine cultures.   We will contact you if other important findings are noted.  Radiology films are often reviewed again to ensure accuracy.  If there is any discrepancy, we will notify you.      Please call [phone] ([place] location) or [phone] ([place] location) to pick up a complimentary CD of any radiology studies performed.  If you or your doctor would like to request a copy of your medical records, please call [phone].      ORTHOPEDIC INJURY   Please know that significant injuries can exist even when an initial x-ray is read as normal or negative.  This can occur because some fractures (broken bones) are not initially visible on x-rays.  For this reason, close outpatient follow-up with your primary care doctor or bone specialist (orthopedist) is required.    MEDICATIONS AND FOLLOWUP  Please be aware that some prescription medications can cause drowsiness.  Use caution when driving or operating machinery.    The examination and treatment you have received in our Emergency Department is provided on an emergency basis, and is not intended to be a substitute for your primary care physician.  It is important that your doctor checks you again and that you report any new or remaining problems at that time.      24 HOUR PHARMACIES  Two nearby 24 hour pharmacies are:    [facility] [place]  [address]  [place], [place] [address]  [phone]    [facility]  [address]  [place], [place] [address]  [phone]      ASSISTANCE WITH INSURANCE    Affordable Care Act  (ACA)  Call to start or finish an application, compare plans, enroll or ask a question.  1-800-318-2596  TTY: 1-855-889-4325  Web:  Healthcare.gov    Help Enrolling in Medicaid  [facility] [place]  (855) 242-8282 (TOLL-FREE)  (888) 221-1590 (TTY)  Web:  Http://www.coverva.org    Local Help Enrolling in  the ACA  [facility]  ([phone] 748-2580 (MAIN)  Email:  health-help@nvfs.org  Web:  Http://www.nvfs.org  Address:  [address], [address] [place], [place] [address]    SEDATING MEDICATIONS  Sedating medications include strong pain medications (e.g. narcotics), muscle relaxers, benzodiazepines (used for anxiety and as muscle relaxers), Benadryl/diphenhydramine and other antihistamines for allergic reactions/itching, and other medications.  If you are unsure if you have received a sedating medication, please ask your physician or nurse.  If you received a sedating medication: DO NOT drive a car. DO NOT operate machinery. DO NOT perform jobs where you need to be alert.  DO NOT drink alcoholic beverages while taking this medicine.     If you get dizzy, sit or lie down at the first signs. Be careful going up and down stairs.  Be extra careful to prevent falls.     Never give this medicine to others.     Keep this medicine out   of reach of children.     Do not take or save old medicines. Throw them away when outdated.     Keep all medicines in a cool, dry place. DO NOT keep them in your bathroom medicine cabinet or in a cabinet above the stove.    MEDICATION REFILLS  Please be aware that we cannot refill any prescriptions through the ER. If you need further treatment from what is provided at your ER visit, please follow up with your primary care doctor or your pain management specialist.

## 2017-09-27 NOTE — ED Provider Notes (Signed)
[facility] EMERGENCY DEPARTMENT H&P      Visit date: 09/27/2017      CLINICAL SUMMARY          Diagnosis:    .     Final diagnoses:   Acute respiratory distress   Nausea   Diarrhea, unspecified type   Viral syndrome         MDM Notes:      Lawrence Rosario is a 22 y.o. old male presenting for nausea, diarrhea, and severe respiratory distress. Vitals stable. Nursing notes reviewed. Exam as listed.    Differential includes pneumothorax, PE, pneumonia, gastroenteritis, DKA, HHS,sepsis. Based on unremarkable labs or tests, unremarkable EKG, unremarkable x-ray, and low heart score criteria, it is unlikely the patient is suffering from or needs admission for an acute coronary event at this time.  Unlikely PE (dimer negative), dissection, pneumothorax, pneumonia. Patient's symptoms significantly improved after completion of 1 L normal saline. Shortness of breath, resolved after fluid resuscitation suggestive of possible compensating respiratory measures from metabolic changes induced by multiple episodes of diarrhea. Based on unremarkable exam, patient appearing remarkably nontoxic on repeat evaluation, it is unlikely that the patient is suffering from serious bacterial illness, sepsis, meningitis, pneumonia, urinary tract infection, or other emergent etiology requiring antibiotics, surgery, or admission at this time.    Advised patient at length the necessity of close primary care follow-up for continued evaluation and management symptoms. Also discussed warning signs and instructed patient to return to the ED if she develops any new or worsening symptoms. Patient voiced understanding of instructions, questions were answered and the patient was discharged home. Patient remained stable during the rest of her ED course.         Disposition:         Discharge       Discharge Prescriptions     None                    CLINICAL INFORMATION        HPI:      Chief Complaint: Shortness of  Breath  .    Lawrence Rosario is a 21 y.o. male who presents with cute shortness of breath with associated nausea, vomiting, diarrhea.  History obtained from patient.  Reports that on the drive up from [place].  He started experiencing severe nausea.  He stopped at a gas station and had one hour of non bloody, non-melanotic diarrhea.  No vomiting, but felt severely nauseous.  Upon returning to driving back to [place], started experiencing worsening shortness of breath and therefore presents to the emergency department.  Patient extremely anxious that symptoms really related to previous history of multiple spontaneous pneumothoraces in the past.  On evaluation in the emergency department, primarily complaining of severe difficulty breathing.  Denies associated chest pain.  Continues to have nausea.  No further episodes of vomiting or diarrhea.  No new focal weakness.  No fevers. No new lower extremity edema, recent surgery, hemoptysis, history of previous VTE. No sick contacts.     History obtained from: Patient        ROS:      Positive and negative ROS elements as per HPI.  All other systems reviewed and negative.      Physical Exam:      Pulse (!) 120  BP (!) 155/91  Resp (!) 26  SpO2 96 %  Temp 99 F (37.2 C)    General appearance - alert, well appearing, and  appears in distress  Mental status - alert, oriented to person, place, and time  Eyes - pupils equal and reactive, extraocular eye movements intact  Nose - normal and patent, no erythema, discharge or polyps  Mouth - mucous membranes dry, pharynx with swelling and erythema, no obvious obstruction, no lesions  Neck - supple, no significant adenopathy  Lymphatics - no palpable lymphadenopathy, no hepatosplenomegaly  Chest - tachypnic, clear to auscultation, no wheezes, rales or rhonchi, symmetric air entry  Heart - sinus tachycardia, regular rhythm, normal S1, S2, no murmurs, rubs, clicks or gallops  Abdomen - soft, nontender, nondistended, no  masses or organomegaly  Back exam - full range of motion, no tenderness, palpable spasm or pain on motion  Neurological - alert, oriented, normal speech, no focal findings or movement disorder noted  Musculoskeletal - no joint tenderness, deformity or swelling  Extremities - peripheral pulses normal, no pedal edema, no clubbing or cyanosis  Skin - normal coloration and turgor, no rashes, no suspicious skin lesions noted            PAST HISTORY        Primary Care Provider: Rulon Abide, NP        PMH/PSH:    .     Past Medical History:   Diagnosis Date   . Lung collapse    . Migraine        He has a past surgical history that includes Lung surgery and Chest tube insertion.      Social/Family History:      He reports that he has never smoked. He has never used smokeless tobacco. He reports that he does not drink alcohol or use drugs.    History reviewed. No pertinent family history.      Listed Medications on Arrival:    .     Home Medications     Med List Status:  Complete Set By: Maurene Capes, RN at 09/27/2017  1:01 AM        No Medications         Allergies: He has No Known Allergies.            VISIT INFORMATION        Clinical Course in the ED:      Throughout the stay in the Emergency Department, questions and concerns surrounding pain management, care plan, diagnostic studies, effects of medications administered or prescribed, and future care plan were assessed and addressed.           Medications Given in the ED:    .     ED Medication Orders     Start Ordered     Status Ordering Provider    09/27/17 0108 09/27/17 0107  sodium chloride 0.9 % bolus 1,000 mL  Once     Route: Intravenous  Ordered Dose: 1,000 mL     Last MAR action:  Stopped Taeko Schaffer MOHAMMAD            Procedures:      Procedures      Interpretations:      O2 sat-           saturation: 96 %; Oxygen use: room air; Interpretation: Normal    Radiology -     interpreted by me with the following observations: no acute cardiopulmonary  findings, no pneumothorax  EKG -             interpreted by me: sinus tachycardia at 125.  No  ST abnormalities, No old EKG for comparison and Clinical significance: shortness of breath  Monitor -         interpreted by me: sinus tachycardia at 120.                 RESULTS        Lab Results:      Results     Procedure Component Value Units Date/Time    Rapid Strep (ADULT ED only) [[id number]] Collected:  09/27/17 0205    Specimen:  Throat Updated:  09/27/17 0216     Group A Strep, Rapid Antigen Negative    Comprehensive Metabolic Panel (CMP) [[id number]]  (Abnormal) Collected:  09/27/17 0110    Specimen:  Blood Updated:  09/27/17 0135     Glucose 107 (H) mg/dL      BUN 64.4 mg/dL      Creatinine 1.1 mg/dL      Sodium 034 mEq/L      Potassium 4.1 mEq/L      Chloride 103 mEq/L      CO2 25 mEq/L      Calcium 10.0 mg/dL      Protein, Total 7.9 g/dL      Albumin 4.8 g/dL      AST (SGOT) 19 U/L      ALT 31 U/L      Alkaline Phosphatase 79 U/L      Bilirubin, Total 0.4 mg/dL      Globulin 3.1 g/dL      Albumin/Globulin Ratio 1.5     Anion Gap 11.0    GFR [[id number]] Collected:  09/27/17 0110     Updated:  09/27/17 0135     EGFR >60.0    D-Dimer [[id number]] Collected:  09/27/17 0110     Updated:  09/27/17 0129     D-Dimer <0.27 ug/mL FEU     i-Stat Troponin [[id number]] Collected:  09/27/17 0110     Updated:  09/27/17 0127     i-STAT Troponin 0.01 ng/mL     CBC with Differential [[id number]]  (Abnormal) Collected:  09/27/17 0110    Specimen:  Blood from Blood Updated:  09/27/17 0119     WBC 16.37 (H) x10 3/uL      Hgb 15.3 g/dL      Hematocrit 23.5 %      Platelets 224 x10 3/uL      RBC 5.21 x10 6/uL      MCV 87.9 fL      MCH 29.4 pg      MCHC 33.4 g/dL      RDW 12 %      MPV 9.8 fL      Neutrophils 84.0 %      Lymphocytes Automated 7.1 %      Monocytes 8.2 %      Eosinophils Automated 0.1 %      Basophils Automated 0.3 %      Immature Granulocyte 0.3 %      Nucleated RBC 0.0 /100 WBC      Neutrophils Absolute 13.75 (H) x10  3/uL      Abs Lymph Automated 1.16 x10 3/uL      Abs Mono Automated 1.34 (H) x10 3/uL      Abs Eos Automated 0.02 x10 3/uL      Absolute Baso Automated 0.05 x10 3/uL      Absolute Immature Granulocyte 0.05 x10 3/uL      Absolute NRBC 0.00 x10 3/uL  Radiology Results:      Chest AP Portable   Final Result    Radiographic examination of the chest shows surgical changes   at the right lung apex. The lungs are well-expanded and clear. There is   no pneumothorax. No definite abnormality is identified to explain the   reported symptoms.                                              Miguel Dibble, MD    09/27/2017 1:35 AM                    Zyree Traynham, Mohammed Kindle, MD  09/27/17 1610

## 2017-09-28 LAB — ECG 12-LEAD
Atrial Rate: 125 {beats}/min
P Axis: 65 degrees
P-R Interval: 166 ms
Q-T Interval: 294 ms
QRS Duration: 78 ms
QTC Calculation (Bezet): 424 ms
R Axis: 76 degrees
T Axis: 50 degrees
Ventricular Rate: 125 {beats}/min

## 2017-11-29 ENCOUNTER — Ambulatory Visit (INDEPENDENT_AMBULATORY_CARE_PROVIDER_SITE_OTHER): Payer: 59 | Admitting: Family Medicine

## 2017-11-29 ENCOUNTER — Encounter: Payer: Self-pay | Admitting: Family Medicine

## 2017-11-29 VITALS — BP 102/62 | HR 64 | Temp 97.8°F | Resp 16 | Wt 179.0 lb

## 2017-11-29 DIAGNOSIS — J0110 Acute frontal sinusitis, unspecified: Secondary | ICD-10-CM | POA: Diagnosis not present

## 2017-11-29 MED ORDER — AZITHROMYCIN 250 MG PO TABS: ORAL_TABLET | ORAL | 0 refills | Status: AC

## 2017-11-29 NOTE — Progress Notes (Signed)
       Patient: Tanner Brown Male    DOB: 1996/04/07   22 y.o.   MRN: [id number] Visit Date: 11/29/2017  Today's Provider: Mila Merry, MD   Chief Complaint  Patient presents with  . URI   Subjective:    URI   This is a new problem. Maximum temperature: low grade fever 99.5. Associated symptoms include congestion, coughing (with green, yellow and blood tinged sputum), headaches, rhinorrhea, sinus pain, sneezing and a sore throat. Pertinent negatives include no abdominal pain, chest pain, diarrhea, dysuria, ear pain, nausea, vomiting or wheezing. Treatments tried: Robitussin and Aleve. The treatment provided mild relief.       No Known Allergies   Current Outpatient Medications:  .  budesonide-formoterol (SYMBICORT) 80-4.5 MCG/ACT inhaler, Inhale 2 puffs into the lungs 2 (two) times daily. (Patient not taking: Reported on 07/22/2017), Disp: 1 Inhaler, Rfl: 0 .  dicyclomine (BENTYL) 20 MG tablet, Take 1 tablet (20 mg total) by mouth every 6 (six) hours as needed (abdominal pain or diarrhea)., Disp: 120 tablet, Rfl: 2 .  omeprazole (PRILOSEC) 20 MG capsule, Take 1 capsule (20 mg total) by mouth daily., Disp: 30 capsule, Rfl: 2 .  SUMAtriptan (IMITREX) 100 MG tablet, Take by mouth., Disp: , Rfl:   Review of Systems  Constitutional: Positive for chills, diaphoresis, fatigue and fever (low grade). Negative for appetite change.  HENT: Positive for congestion, rhinorrhea, sinus pain, sneezing and sore throat. Negative for ear pain.   Respiratory: Positive for cough (with green, yellow and blood tinged sputum). Negative for chest tightness, shortness of breath and wheezing.   Cardiovascular: Negative for chest pain and palpitations.  Gastrointestinal: Negative for abdominal pain, diarrhea, nausea and vomiting.  Genitourinary: Negative for dysuria.  Neurological: Positive for headaches.    Social History   Tobacco Use  . Smoking status: Never Smoker  . Smokeless tobacco: Never  Used  Substance Use Topics  . Alcohol use: No   Objective:   BP 102/62 (BP Location: Left Arm, Patient Position: Sitting, Cuff Size: Large)   Pulse 64   Temp 97.8 F (36.6 C) (Oral)   Resp 16   Wt 179 lb (81.2 kg)   SpO2 97% Comment: room air  BMI 21.79 kg/m     Physical Exam  General Appearance:    Alert, cooperative, no distress  HENT:   bilateral TM normal without fluid or infection, neck without nodes, throat normal without erythema or exudate and right frontal sinuses tender  Eyes:    PERRL, conjunctiva/corneas clear, EOM's intact       Lungs:     Clear to auscultation bilaterally, respirations unlabored  Heart:    Regular rate and rhythm  Neurologic:   Awake, alert, oriented x 3. No apparent focal neurological           defect.           Assessment & Plan:     There are no diagnoses linked to this encounter.       Mila Merry, MD  [facility]

## 2018-01-10 ENCOUNTER — Ambulatory Visit (INDEPENDENT_AMBULATORY_CARE_PROVIDER_SITE_OTHER): Payer: 59 | Admitting: Family Medicine

## 2018-01-10 ENCOUNTER — Encounter: Payer: Self-pay | Admitting: Family Medicine

## 2018-01-10 VITALS — BP 108/60 | HR 78 | Temp 98.5°F | Resp 16 | Wt 185.0 lb

## 2018-01-10 DIAGNOSIS — G43809 Other migraine, not intractable, without status migrainosus: Secondary | ICD-10-CM | POA: Diagnosis not present

## 2018-01-10 DIAGNOSIS — R1110 Vomiting, unspecified: Secondary | ICD-10-CM | POA: Diagnosis not present

## 2018-01-10 MED ORDER — TOPIRAMATE 25 MG PO TABS: ORAL_TABLET | ORAL | 1 refills | Status: DC

## 2018-01-10 NOTE — Progress Notes (Signed)
Patient: Tanner Brown Male    DOB: [birth year]   22 y.o.   MRN: [id number] Visit Date: 01/10/2018  Today's Provider: Mila Merry, MD   Chief Complaint  Patient presents with  . Emesis   Subjective:    Emesis   This is a new problem. Episode onset: 2 weeks ago. Progression since onset: recurring. Associated symptoms include abdominal pain (cramping). Pertinent negatives include no chest pain, chills, coughing, diarrhea, fever, headaches or sweats.  Patient states he has had 3-4 episodes within the past 2 weeks where he wakes up a few hours after going to bed with vomiting and nausea. Takes OTC emetrol, but often wakes again a few hours later.Marland Kitchen He states these symptoms only occur at night. Stomach hurts after vomiting and sometimes the next day. No diarrhea. No fevers or chills. Is not at all similar to pains he had when he was seen in January. He states he has taken dicyclomine intermittently for diarrhea and stomach cramping, but has not had to for several weeks.  Has not noticed any particular food associations.   He also reports he has been having migraines more frequently and using Imitrex a couple of times every week or two for the last several months. Has not noticed any correlation of migraines with nausea. States that Imitrex continues to work well for migraines.   No Known Allergies   Current Outpatient Medications:  .  SUMAtriptan (IMITREX) 100 MG tablet, Take by mouth., Disp: , Rfl:   Review of Systems  Constitutional: Positive for fatigue. Negative for appetite change, chills and fever.  Respiratory: Negative for cough, chest tightness, shortness of breath and wheezing.   Cardiovascular: Negative for chest pain and palpitations.  Gastrointestinal: Positive for abdominal pain (cramping), nausea and vomiting. Negative for abdominal distention, blood in stool, constipation and diarrhea.  Neurological: Negative for headaches.    Social History   Tobacco Use  .  Smoking status: Never Smoker  . Smokeless tobacco: Never Used  Substance Use Topics  . Alcohol use: No   Objective:   BP 108/60 (BP Location: Left Arm, Patient Position: Sitting, Cuff Size: Large)   Pulse 78   Temp 98.5 F (36.9 C) (Oral)   Resp 16   Wt 185 lb (83.9 kg)   SpO2 96% Comment: room air  BMI 22.52 kg/m  Vitals:   01/10/18 1548  BP: 108/60  Pulse: 78  Resp: 16  Temp: 98.5 F (36.9 C)  TempSrc: Oral  SpO2: 96%  Weight: 185 lb (83.9 kg)     Physical Exam  General Appearance:    Alert, cooperative, no distress  Eyes:    PERRL, conjunctiva/corneas clear, EOM's intact       Lungs:     Clear to auscultation bilaterally, respirations unlabored  Heart:    Regular rate and rhythm  Abdomen:   bowel sounds present and normal in all 4 quadrants, soft, round, nontender or nondistended. No CVA tenderness       Assessment & Plan:     1. Recurrent vomiting Not typical for pancreatic or gallbladder disease, and he did have negative GI work up including ultrasound in January. Considering migraine history he may be early presentation of cyclic vomiting syndrome. No sign of any infectious condition.   Considering he is having several migraines a month, it would be reasonable to try migraine prophylaxis. If vomiting does not improve or resolve would consider GI referral.  - topiramate (TOPAMAX) 25 MG tablet;  One tablet at night for one week, then increase to two tablets at night, then increase to 1 in the morning and two at night.  Dispense: 90 tablet; Refill: 1  2. Other migraine without status migrainosus, not intractable No other neurological symptoms. Considering frequency of symptoms will start prophylaxis.  - topiramate (TOPAMAX) 25 MG tablet; One tablet at night for one week, then increase to two tablets at night, then increase to 1 in the morning and two at night.  Dispense: 90 tablet; Refill: 1  Return in about 2 weeks (around 01/24/2018).       Mila Merryonald Kolden Dupee, MD    [facility]

## 2018-01-11 ENCOUNTER — Ambulatory Visit: Payer: Self-pay | Admitting: Family Medicine

## 2018-01-24 ENCOUNTER — Encounter: Payer: Self-pay | Admitting: Family Medicine

## 2018-01-24 ENCOUNTER — Ambulatory Visit (INDEPENDENT_AMBULATORY_CARE_PROVIDER_SITE_OTHER): Payer: 59 | Admitting: Family Medicine

## 2018-01-24 VITALS — BP 110/68 | HR 95 | Temp 98.7°F | Resp 16 | Wt 187.0 lb

## 2018-01-24 DIAGNOSIS — G43809 Other migraine, not intractable, without status migrainosus: Secondary | ICD-10-CM | POA: Diagnosis not present

## 2018-01-24 DIAGNOSIS — R1110 Vomiting, unspecified: Secondary | ICD-10-CM

## 2018-01-24 MED ORDER — SUMATRIPTAN SUCCINATE 100 MG PO TABS: ORAL_TABLET | ORAL | 3 refills | Status: DC

## 2018-01-24 NOTE — Progress Notes (Signed)
       Patient: Tanner Brown Male    DOB: 10/23/1995   22 y.o.   MRN: [id number] Visit Date: 01/24/2018  Today's Provider: Mila Merryonald Briany Aye, MD   Chief Complaint  Patient presents with  . Migraine    follow up   Subjective:    HPI  Other migraine without status migrainosus, not intractable From 01/10/2018-started topiramate (TOPAMAX) 25 MG tablet.  Today reports that the migraines and vomiting have significantly improved since the last visit. He states he has only had one episode of migraines and vomiting since starting topiramate, and it was after missing 2 days of medications. He also reports he has only been taking one 25mg  tablet at night and not titrated dose up.  Denies any changes in appetite or mood and not aware of any adverse effects from medication.     No Known Allergies   Current Outpatient Medications:  .  SUMAtriptan (IMITREX) 100 MG tablet, Take by mouth., Disp: , Rfl:  .  topiramate (TOPAMAX) 25 MG tablet, One tablet at night for one week, then increase to two tablets at night, then increase to 1 in the morning and two at night., Disp: 90 tablet, Rfl: 1  Review of Systems  Constitutional: Negative for appetite change, chills and fever.  Respiratory: Negative for chest tightness, shortness of breath and wheezing.   Cardiovascular: Negative for chest pain and palpitations.  Gastrointestinal: Negative for abdominal pain, nausea and vomiting.    Social History   Tobacco Use  . Smoking status: Never Smoker  . Smokeless tobacco: Never Used  Substance Use Topics  . Alcohol use: No   Objective:   BP 110/68 (BP Location: Left Arm, Patient Position: Sitting, Cuff Size: Large)   Pulse 95   Temp 98.7 F (37.1 C) (Oral)   Resp 16   Wt 187 lb (84.8 kg)   SpO2 98% Comment: room air  BMI 22.76 kg/m  Vitals:   01/24/18 1618  BP: 110/68  Pulse: 95  Resp: 16  Temp: 98.7 F (37.1 C)  TempSrc: Oral  SpO2: 98%  Weight: 187 lb (84.8 kg)     Physical  Exam  General appearance: alert, well developed, well nourished, cooperative and in no distress Head: Normocephalic, without obvious abnormality, atraumatic Respiratory: Respirations even and unlabored, normal respiratory rate Extremities: No gross deformities Skin: Skin color, texture, turgor normal. No rashes seen  Psych: Appropriate mood and affect. Neurologic: Mental status: Alert, oriented to person, place, and time, thought content appropriate.     Assessment & Plan:     1. Other migraine without status migrainosus, not intractable Seems to have improved on low dose of topiramate, as only day of symptoms was after missing medication. Advised to go increase to 50mg  (2 x 25) at night. If effective will change to a 50mg  tablet a night.   2. Recurrent vomiting No more nocturnal vomiting episodes since last visit, and only vomiting episode since last visit was associated with migraine. Increase topiramate as above. If not resolving will consider neuroimaging. Or GI referral.         Mila Merryonald Chayne Baumgart, MD  [facility]

## 2018-01-24 NOTE — Patient Instructions (Addendum)
   Increase topiramate to 2 x 25mg  tablets at bedtime. If this remains effective, call to change to a 50mg  tablet before your prescription runs out.    Call if any more episodes of vomiting on higher dose of topiramate

## 2018-04-01 DIAGNOSIS — H5213 Myopia, bilateral: Secondary | ICD-10-CM | POA: Diagnosis not present

## 2018-08-30 ENCOUNTER — Ambulatory Visit (INDEPENDENT_AMBULATORY_CARE_PROVIDER_SITE_OTHER): Payer: 59 | Admitting: Family Medicine

## 2018-08-30 ENCOUNTER — Encounter: Payer: Self-pay | Admitting: Family Medicine

## 2018-08-30 VITALS — BP 114/79 | HR 67 | Temp 98.0°F | Wt 191.0 lb

## 2018-08-30 DIAGNOSIS — R519 Headache, unspecified: Secondary | ICD-10-CM

## 2018-08-30 DIAGNOSIS — K529 Noninfective gastroenteritis and colitis, unspecified: Secondary | ICD-10-CM

## 2018-08-30 DIAGNOSIS — G4489 Other headache syndrome: Secondary | ICD-10-CM

## 2018-08-30 DIAGNOSIS — J069 Acute upper respiratory infection, unspecified: Secondary | ICD-10-CM | POA: Diagnosis not present

## 2018-08-30 DIAGNOSIS — R112 Nausea with vomiting, unspecified: Secondary | ICD-10-CM | POA: Diagnosis not present

## 2018-08-30 DIAGNOSIS — R51 Headache: Secondary | ICD-10-CM | POA: Diagnosis not present

## 2018-08-30 NOTE — Progress Notes (Signed)
Patient: Tanner Brown Male    DOB: 05/21/1996   23 y.o.   MRN: [id number] Visit Date: 08/30/2018  Today's Provider: Mila Merry, MD   Chief Complaint  Patient presents with  . Cough  . Nausea   Subjective:     URI   This is a new problem. The current episode started in the past 7 days. The problem has been gradually worsening. Associated symptoms include congestion, coughing, diarrhea (Coming and going for about a month), nausea, rhinorrhea and a sore throat. Pertinent negatives include no abdominal pain, ear pain, headaches ( Pt reports having a Migraine a few days ago but has since resolved. ), neck pain, plugged ear sensation, sinus pain, sneezing, swollen glands, vomiting or wheezing. The treatment provided no relief.   Follow up migraine headaches He states headaches have been increasing infrequency. Is now having headaches every week. The most recent one was this weekend on the right side of head. States they are usually on one side, but can't say for sure if always on the same side. Associated with nausea and sometimes vomiting with photophobia. States he occasionally awakens from headache. They usually last 4-12 hours. Imitrex usually helps. Was prescribed topiramate last summer which he states he took through December, but doesn't think headaches were any less frequent when he was taking it.   Also states he has chronic loose bowel movements just about every day. Has been that way for a few years. Sometimes associated with abdominal cramping, but never sees any blood or dark stool. Is not associated with nausea, vomiting, or headaches as above. He thinks it may be related to milk, but states he consumes a lot of milk and has not really tried to abstain to see if diarrhea improves on it.   No Known Allergies   Current Outpatient Medications:  .  SUMAtriptan (IMITREX) 100 MG tablet, Take one tablet at onset of migraine headache. May take a second tablet if needed after 2  hours, Disp: 10 tablet, Rfl: 3 .  topiramate (TOPAMAX) 25 MG tablet, One tablet at night for one week, then increase to two tablets at night, then increase to 1 in the morning and two at night., Disp: 90 tablet, Rfl: 1  Review of Systems  Constitutional: Positive for chills, diaphoresis and fatigue. Negative for activity change, appetite change, fever and unexpected weight change.  HENT: Positive for congestion, postnasal drip, rhinorrhea and sore throat. Negative for ear discharge, ear pain, sinus pressure, sinus pain, sneezing, tinnitus, trouble swallowing and voice change.   Eyes: Negative.   Respiratory: Positive for cough and chest tightness. Negative for apnea, choking, shortness of breath, wheezing and stridor.   Gastrointestinal: Positive for diarrhea (Coming and going for about a month) and nausea. Negative for abdominal distention, abdominal pain, anal bleeding, blood in stool, constipation, rectal pain and vomiting.  Musculoskeletal: Positive for back pain. Negative for neck pain and neck stiffness.  Neurological: Negative for dizziness, light-headedness and headaches ( Pt reports having a Migraine a few days ago but has since resolved. ).  Hematological: Negative for adenopathy.    Social History   Tobacco Use  . Smoking status: Never Smoker  . Smokeless tobacco: Never Used  Substance Use Topics  . Alcohol use: No      Objective:   BP 114/79 (BP Location: Right Arm, Patient Position: Sitting, Cuff Size: Normal)   Pulse 67   Temp 98 F (36.7 C) (Oral)   Wt 191  lb (86.6 kg)   SpO2 97%   BMI 23.25 kg/m  Vitals:   08/30/18 1532  BP: 114/79  Pulse: 67  Temp: 98 F (36.7 C)  TempSrc: Oral  SpO2: 97%  Weight: 191 lb (86.6 kg)    Physical Exam  General Appearance:    Alert, cooperative, no distress  HENT:   bilateral TM normal without fluid or infection, neck without nodes, throat normal without erythema or exudate, sinuses nontender and nasal mucosa pale and  congested  Eyes:    PERRL, conjunctiva/corneas clear, EOM's intact       Lungs:     Clear to auscultation bilaterally, respirations unlabored  Heart:    Regular rate and rhythm  Neurologic:   Awake, alert, oriented x 3. No apparent focal neurological           defect.   Abd:   No masses, non-tender.        Assessment & Plan    1. Upper respiratory tract infection, unspecified type Symptoms for just a few days with no sign of bacterial infection. Counseled on s/s of bacterial infection and call if any develop.   2. Other headache syndrome Has been treated for presumed migraines for several years, but are occurring with increasing frequency with red flags of unilateral headache and waking from headaches.  Need to rule out any possible structural causes of headaches.  - MR Brain W Wo Contrast; Future  Headache frequency did not improve with topiramate. Consider a TCA  3. Frequent headaches  - MR Brain W Wo Contrast; Future  4. Nausea and vomiting, intractability of vomiting not specified, unspecified vomiting type  - MR Brain W Wo Contrast; Future  5. Chronic diarrhea of unknown origin Possible related to consumption of large amount of milk. Advised to abstain for a week. Check  - Celiac Disease Ab Screen w/Rfx - Comprehensive metabolic panel - CBC with Differential/Platelet - TSH     Mila Merry, MD  [facility]

## 2018-08-30 NOTE — Patient Instructions (Signed)
.   Please review the attached list of medications and notify my office if there are any errors.   . Completely eliminate dairy from your diet for at least the next week

## 2018-08-31 LAB — CBC WITH DIFFERENTIAL/PLATELET
BASOS ABS: 0.1 10*3/uL (ref 0.0–0.2)
Basos: 1 %
EOS (ABSOLUTE): 0.2 10*3/uL (ref 0.0–0.4)
Eos: 2 %
HEMOGLOBIN: 15 g/dL (ref 13.0–17.7)
Hematocrit: 43 % (ref 37.5–51.0)
Immature Grans (Abs): 0 10*3/uL (ref 0.0–0.1)
Immature Granulocytes: 0 %
LYMPHS ABS: 1.4 10*3/uL (ref 0.7–3.1)
Lymphs: 20 %
MCH: 30.2 pg (ref 26.6–33.0)
MCHC: 34.9 g/dL (ref 31.5–35.7)
MCV: 87 fL (ref 79–97)
MONOCYTES: 11 %
MONOS ABS: 0.7 10*3/uL (ref 0.1–0.9)
NEUTROS ABS: 4.7 10*3/uL (ref 1.4–7.0)
Neutrophils: 66 %
PLATELETS: 249 10*3/uL (ref 150–450)
RBC: 4.96 x10E6/uL (ref 4.14–5.80)
RDW: 12.8 % (ref 11.6–15.4)
WBC: 7 10*3/uL (ref 3.4–10.8)

## 2018-08-31 LAB — COMPREHENSIVE METABOLIC PANEL
A/G RATIO: 1.9 (ref 1.2–2.2)
ALBUMIN: 4.7 g/dL (ref 4.1–5.2)
ALK PHOS: 74 IU/L (ref 39–117)
ALT: 23 IU/L (ref 0–44)
AST: 14 IU/L (ref 0–40)
BILIRUBIN TOTAL: 0.4 mg/dL (ref 0.0–1.2)
BUN/Creatinine Ratio: 10 (ref 9–20)
BUN: 12 mg/dL (ref 6–20)
CHLORIDE: 102 mmol/L (ref 96–106)
CO2: 24 mmol/L (ref 20–29)
Calcium: 9.7 mg/dL (ref 8.7–10.2)
Creatinine, Ser: 1.16 mg/dL (ref 0.76–1.27)
GFR calc non Af Amer: 89 mL/min/{1.73_m2} (ref >59)
GFR, EST AFRICAN AMERICAN: 103 mL/min/{1.73_m2} (ref >59)
GLUCOSE: 84 mg/dL (ref 65–99)
Globulin, Total: 2.5 g/dL (ref 1.5–4.5)
POTASSIUM: 4.3 mmol/L (ref 3.5–5.2)
Sodium: 140 mmol/L (ref 134–144)
Total Protein: 7.2 g/dL (ref 6.0–8.5)

## 2018-08-31 LAB — TSH: TSH: 2.08 u[IU]/mL (ref 0.450–4.500)

## 2018-08-31 LAB — CELIAC DISEASE AB SCREEN W/RFX
ANTIGLIADIN ABS, IGA: 5 U (ref 0–19)
IgA/Immunoglobulin A, Serum: 214 mg/dL (ref 90–386)
Transglutaminase IgA: <2 U/mL (ref 0–3)

## 2018-09-01 ENCOUNTER — Telehealth: Payer: Self-pay | Admitting: Family Medicine

## 2018-09-01 NOTE — Telephone Encounter (Signed)
Patient is scheduled for MRI on 09/02/2018. Can he be scheduled for office visit next week to follow up on MRI, headaches, and GI problems.

## 2018-09-01 NOTE — Telephone Encounter (Signed)
Apt made for 09/07/2018 at 4pm  Thanks,   -Vernona Rieger

## 2018-09-02 ENCOUNTER — Ambulatory Visit
Admission: RE | Admit: 2018-09-02 | Discharge: 2018-09-02 | Disposition: A | Discharge status: Home / Self Care | Payer: 59 | Source: Ambulatory Visit | Attending: Family Medicine | Admitting: Family Medicine

## 2018-09-02 DIAGNOSIS — R519 Headache, unspecified: Secondary | ICD-10-CM

## 2018-09-02 DIAGNOSIS — G4489 Other headache syndrome: Secondary | ICD-10-CM | POA: Insufficient documentation

## 2018-09-02 DIAGNOSIS — R112 Nausea with vomiting, unspecified: Secondary | ICD-10-CM | POA: Diagnosis not present

## 2018-09-02 DIAGNOSIS — R51 Headache: Secondary | ICD-10-CM | POA: Diagnosis not present

## 2018-09-02 MED ORDER — GADOBUTROL 1 MMOL/ML IV SOLN
8.0000 mL | Freq: Once | INTRAVENOUS | Status: AC | PRN
  Administered 2018-09-02: 8 mL via INTRAVENOUS

## 2018-09-07 ENCOUNTER — Ambulatory Visit (INDEPENDENT_AMBULATORY_CARE_PROVIDER_SITE_OTHER): Payer: 59 | Admitting: Family Medicine

## 2018-09-07 VITALS — BP 124/76 | HR 64 | Temp 98.1°F | Resp 16 | Wt 193.0 lb

## 2018-09-07 DIAGNOSIS — G43809 Other migraine, not intractable, without status migrainosus: Secondary | ICD-10-CM | POA: Diagnosis not present

## 2018-09-07 DIAGNOSIS — E739 Lactose intolerance, unspecified: Secondary | ICD-10-CM

## 2018-09-07 MED ORDER — AMITRIPTYLINE HCL 10 MG PO TABS: ORAL_TABLET | ORAL | 1 refills | Status: DC

## 2018-09-07 NOTE — Patient Instructions (Addendum)
. Please review the attached list of medications and notify my office if there are any errors.   . Some people find that certain OTC vitamins help prevent migraines. You can try taking riboflavin 400mg  once daily and magnesium dicitrate 600mg  once daily and Coenzyme Q10 100mg  two times a day    Lactose Intolerance, Adult Lactose is a natural sugar that is found in dairy milk and dairy products such as cheese and yogurt. Lactose is digested by lactase, a protein (enzyme) in your small intestine. Some people do not produce enough lactase to digest lactose. This is called lactose intolerance. Lactose intolerance is different from milk allergy, which is a more serious reaction to the protein in milk. What are the causes? Causes of lactose intolerance may include:  Normal aging. The ability to produce lactase may lessen with age, causing lactose intolerance over time.  Being born without the ability to make lactase.  Digestive diseases such as gastroenteritis or inflammatory bowel disease (IBD).  Surgery or injury to your small intestine.  Infection in your intestines.  Certain antibiotic medicines and cancer treatments. What are the signs or symptoms? Lactose intolerance can cause discomfort within 30 minutes to 2 hours after you eat or drink something that contains lactose. Symptoms may include:  Nausea.  Diarrhea.  Cramps or pain in the abdomen.  A full, tight, or painful feeling in the abdomen (bloating).  Gas. How is this diagnosed? This condition may be diagnosed based on:  Your symptoms and medical history.  Lactose tolerance test. This test involves drinking a lactose solution and then having blood tests to measure the amount of glucose in your blood. If your blood glucose level does not go up, it means your body is not able to digest the lactose.  Lactose breath test (hydrogen breath test). This test involves drinking a lactose solution and then exhaling into a type of  bag while you digest the solution. Having a lot of hydrogen in your breath can be a sign of lactose intolerance.  Stool acidity test. This involves drinking a lactose solution and then having your stool samples tested for bacteria. Having a lot of bacteria causes stool to be considered acidic, which is a sign of lactose intolerance. How is this treated? There is no treatment to improve your body's ability to produce lactase. However, you can manage your symptoms at home by:  Limiting or avoiding dairy milk, dairy products, and other sources of lactose.  Taking lactase tablets when you eat milk products. Lactase tablets are over-the-counter medicines that help to improve lactose digestion. You may also add lactase drops to regular milk.  Adjusting your diet, such as drinking lactose-free milk. Lactose tolerance varies from person to person. Some people may be able to eat or drink small amounts of products that contain lactose, and other people may need to avoid all foods and drinks that contain lactose. Talk with your health care provider about what treatment is best for you. Follow these instructions at home:  Limit or avoid foods, beverages, and medicines that contain lactose, as told by your health care provider. Keep track of which foods, beverages, or medicines cause symptoms so you can decide what to avoid in the future.  Read food and medicine labels carefully. Avoid products that contain: ? Lactose. ? Milk solids. ? Casein. ? Whey.  Take over-the-counter and prescription medicines (including lactase tablets) only as told by your health care provider.  If you stop eating and drinking dairy products (eliminate  dairy from your diet), make sure to get enough protein, calcium, and vitamin D from other foods. Work with your health care provider or a diet and nutrition specialist (dietitian) to make sure you get enough of those nutrients.  Choose a milk substitute that is fortified with  calcium and vitamin D. ? Soy milk contains high-quality protein. ? Milks that are made from nuts or grains (such as almond milk and rice milk) contain very small amounts of protein.  Keep all follow-up visits as told by your health care provider. This is important. Contact a health care provider if:  You have no relief from your symptoms after you have eliminated milk products and other sources of lactose. Get help right away if:  You have blood in your stool.  You have severe abdomen (abdominal) pain. Summary  Lactose is a natural sugar that is found in dairy milk and dairy products such as cheese and yogurt. Lactose is digested by lactase, which is a protein (enzyme) in the small intestine.  Some people do not produce enough lactase to digest lactose. This is called lactose intolerance.  Lactose intolerance can cause discomfort within 30 minutes to 2 hours after you eat or drink something that contains lactose.  Limit or avoid foods, beverages, and medicines that contain lactose, as told by your health care provider. This information is not intended to replace advice given to you by your health care provider. Make sure you discuss any questions you have with your health care provider. Document Released: 06/29/2005 Document Revised: 02/12/2017 Document Reviewed: 02/12/2017 Elsevier Interactive Patient Education  2019 [employer].

## 2018-09-07 NOTE — Progress Notes (Signed)
       Patient: Tanner Brown Male    DOB: 02/15/1996   22 y.o.   MRN: [id number] Visit Date: 09/07/2018  Today's Provider: Mila Merry, MD   Chief Complaint  Patient presents with  . Follow-up   Subjective:     HPI  Follow up for Headaches and chronic diarrhea:  The patient was last seen for this 12 days ago. Changes made at last visit include starting dairy free diet. MRI was normal, and patient was advised to follow up in 2 weeks on Migraines and GI issues.   He states he had only solid BM when he eliminated dairy from his diet. After resuming dairy he started having multiple watery BMs every day again. He feels certain that the dairy is responsible for his sx. Has has not had any blood in stool or unusual pains or cramping.  He continues to have migraines headaches 1-2 times a week  His last migraine was Sunday 09/04/2018. The usually respond to sumatriptan. He has trial of topiramate last year which he didn't think help much, although never titrated to very high dosage.   ------------------------------------------------------------------------------------  No Known Allergies   Current Outpatient Medications:  .  SUMAtriptan (IMITREX) 100 MG tablet, Take one tablet at onset of migraine headache. May take a second tablet if needed after 2 hours, Disp: 10 tablet, Rfl: 3  Review of Systems  Constitutional: Negative.   Respiratory: Positive for cough. Negative for apnea, choking, chest tightness, shortness of breath, wheezing and stridor.   Gastrointestinal: Negative.  Negative for diarrhea.  Neurological: Positive for headaches. Negative for dizziness and light-headedness.    Social History   Tobacco Use  . Smoking status: Never Smoker  . Smokeless tobacco: Never Used  Substance Use Topics  . Alcohol use: No      Objective:   BP 124/76 (BP Location: Right Arm, Patient Position: Sitting, Cuff Size: Normal)   Pulse 64   Temp 98.1 F (36.7 C) (Oral)   Resp 16    Wt 193 lb (87.5 kg)   BMI 23.49 kg/m  Vitals:   09/07/18 1602  BP: 124/76  Pulse: 64  Resp: 16  Temp: 98.1 F (36.7 C)  TempSrc: Oral  Weight: 193 lb (87.5 kg)     Physical Exam  General appearance: alert, well developed, well nourished, cooperative and in no distress Head: Normocephalic, without obvious abnormality, atraumatic Respiratory: Respirations even and unlabored, normal respiratory rate Extremities: No gross deformities Skin: Skin color, texture, turgor normal. No rashes seen  Psych: Appropriate mood and affect. Neurologic: Mental status: Alert, oriented to person, place, and time, thought content appropriate.     Assessment & Plan    1. Other migraine without status migrainosus, not intractable Frequently migraines, but recent MRI was reassuring. Will try- amitriptyline (ELAVIL) 10 MG tablet; Take one at bedtime for one week, then increase to 2 tablets at bedtime  Dispense: 60 tablet; Refill: 1 Return in about 4 weeks for reassessment.   2. Lactose intolerance Given printed information on dietary management.      Mila Merry, MD  [facility]

## 2018-10-10 ENCOUNTER — Other Ambulatory Visit: Payer: Self-pay

## 2018-10-10 ENCOUNTER — Ambulatory Visit (INDEPENDENT_AMBULATORY_CARE_PROVIDER_SITE_OTHER): Payer: 59 | Admitting: Family Medicine

## 2018-10-10 ENCOUNTER — Telehealth: Payer: Self-pay | Admitting: Family Medicine

## 2018-10-10 DIAGNOSIS — G43009 Migraine without aura, not intractable, without status migrainosus: Secondary | ICD-10-CM

## 2018-10-10 NOTE — Progress Notes (Signed)
       Patient: Tanner Brown Male    DOB: 1995/12/20   22 y.o.   MRN: [id number] Visit Date: 10/10/2018  Today's Provider: Mila Merry, MD   Chief Complaint  Patient presents with  . Migraine   Subjective:     HPI  Virtual Visit via Telephone Note  I connected with Tanner Brown on 10/10/18 at  4:00 PM EDT by telephone and verified that I am speaking with the correct person using two identifiers.   Visit today is in regard to treatment for migraine headaches. He was seen on 09/07/2018 at which time he was having his typical migraines 1-2 times each week. He was started on amitriptyline  10mg  hs for 1 week, then titrated to 2 x 10mg  hs. States he had a migraine the first week, but none since taking 20mg  every night. He is tolerating medication, no fatigue or drowsiness, no mood changes. No GI changes, no palpitations or chest pains.    No Known Allergies   Current Outpatient Medications:  .  amitriptyline (ELAVIL) 10 MG tablet, Take one at bedtime for one week, then increase to 2 tablets at bedtime, Disp: 60 tablet, Rfl: 1 .  SUMAtriptan (IMITREX) 100 MG tablet, Take one tablet at onset of migraine headache. May take a second tablet if needed after 2 hours, Disp: 10 tablet, Rfl: 3    Social History   Tobacco Use  . Smoking status: Never Smoker  . Smokeless tobacco: Never Used  Substance Use Topics  . Alcohol use: No          Assessment & Plan    1. Migraine without aura and responsive to treatment Much better with initiation of amitriptyline which he is tolerating well. Counseled to continue same dose for now. Advised he may develop tolerance to current dose and may need adjustment in dose over time. Advised he could call to request dosing change if typical migraines become more frequent again.     I discussed the assessment and treatment plan with the patient. The patient was provided an opportunity to ask questions and all were answered. The patient agreed  with the plan and demonstrated an understanding of the instructions.   The patient was advised to call back or seek an in-person evaluation if the symptoms worsen or if the condition fails to improve as anticipated.  I provided 12 minutes of non-face-to-face time during this encounter.   Mila Merry, MD      Mila Merry, MD  [facility]

## 2018-10-10 NOTE — Telephone Encounter (Signed)
Tanner Brown agreed to a phone visit at 4pm.  He states he does not have access to a computer with a camera attached.  Pt states he is taking Amitriptyline at bedtime.   Thanks,   -Vernona Rieger

## 2018-10-10 NOTE — Telephone Encounter (Signed)
Tried calling; pt's mailbox is full.   Thanks,   -Laura  

## 2018-10-10 NOTE — Patient Instructions (Signed)
.   Please review the attached list of medications and notify my office if there are any errors.   . Please bring all of your medications to every appointment so we can make sure that our medication list is the same as yours.   

## 2018-10-10 NOTE — Telephone Encounter (Signed)
Considering stay at home order, please see if patient will change to Webex visit this afternoon. If he can't do webex we can do a telephone visit and I can call him at his appointment time instead of him coming to the office.

## 2018-11-04 ENCOUNTER — Telehealth: Payer: Self-pay | Admitting: Family Medicine

## 2018-11-04 MED ORDER — RIMEGEPANT SULFATE 75 MG PO TBDP: 1.0000 | ORAL_TABLET | ORAL | 3 refills | Status: DC | PRN

## 2018-11-04 NOTE — Telephone Encounter (Signed)
Patient's dad requests trial of Nurtec  for migraines prn. Patient had previously reported that headaches are much less frequent since starting amitriptyline which he will continue.

## 2018-11-08 NOTE — Telephone Encounter (Signed)
Can you check with [facility] regarding prior authorization for Nurtec. Patient has filed Imitrex, amitriptyline, and topiramate.

## 2018-11-08 NOTE — Telephone Encounter (Signed)
Prior authorization started through covermymeds. Awaiting response from insurance.

## 2018-11-17 ENCOUNTER — Telehealth: Payer: Self-pay

## 2018-11-17 NOTE — Telephone Encounter (Signed)
Cover my meds called stating that the Rx for Nurtec 75 was written for 8 pills per 10 days.   His insurance plan will cover 16 pills per 30 days. Can the request be changed to reflect this amount?  Please call [phone] Ref # [id number]

## 2018-11-17 NOTE — Telephone Encounter (Signed)
That's fine, can change to 16 tablets.

## 2018-11-18 MED ORDER — RIMEGEPANT SULFATE 75 MG PO TBDP: 1.0000 | ORAL_TABLET | ORAL | 3 refills | Status: DC | PRN

## 2018-11-18 NOTE — Telephone Encounter (Signed)
Re sent in medication for 16 tablets.

## 2018-12-13 ENCOUNTER — Other Ambulatory Visit: Payer: Self-pay | Admitting: Family Medicine

## 2018-12-13 DIAGNOSIS — R112 Nausea with vomiting, unspecified: Secondary | ICD-10-CM

## 2018-12-13 DIAGNOSIS — K529 Noninfective gastroenteritis and colitis, unspecified: Secondary | ICD-10-CM

## 2018-12-13 NOTE — Progress Notes (Signed)
Received message from Dr. Sullivan Lone that Tanner Brown is having more trouble with nausea and frequent diarrhea sometimes several times a day. Requested referral to dr. Servando Snare for evaluation.

## 2018-12-14 ENCOUNTER — Other Ambulatory Visit: Payer: Self-pay

## 2018-12-14 ENCOUNTER — Ambulatory Visit (INDEPENDENT_AMBULATORY_CARE_PROVIDER_SITE_OTHER): Payer: 59 | Admitting: Gastroenterology

## 2018-12-14 ENCOUNTER — Encounter (INDEPENDENT_AMBULATORY_CARE_PROVIDER_SITE_OTHER): Payer: Self-pay

## 2018-12-14 ENCOUNTER — Encounter: Payer: Self-pay | Admitting: Gastroenterology

## 2018-12-14 VITALS — BP 107/71 | HR 80 | Temp 97.8°F | Ht 76.0 in | Wt 191.4 lb

## 2018-12-14 DIAGNOSIS — E739 Lactose intolerance, unspecified: Secondary | ICD-10-CM | POA: Diagnosis not present

## 2018-12-14 DIAGNOSIS — R112 Nausea with vomiting, unspecified: Secondary | ICD-10-CM

## 2018-12-14 DIAGNOSIS — K591 Functional diarrhea: Secondary | ICD-10-CM | POA: Diagnosis not present

## 2018-12-14 NOTE — Progress Notes (Signed)
Gastroenterology Consultation  Referring Provider:     Malva Limes, MD Primary Care Physician:  Tanner Limes, MD Primary Gastroenterologist:  Tanner Brown     Reason for Consultation:     Nausea and diarrhea        HPI:   Tanner Brown is a 23 y.o. y/o male referred for consultation & management of nausea and diarrhea by Dr. Sherrie Brown, Tanner Isaacs, MD.  This patient comes in today after seeing his primary care provider yesterday with a report of nausea and diarrhea.  The patient has a history of a left spontaneous pneumothorax and migraine headaches.  The patient underwent an MRI of the brain in February which showed:  IMPRESSION: Negative MRI the brain. No acute or focal lesion to explain the patient's symptoms.  The patient's diarrhea was thought to possibly be from lactose intolerance and his primary care provider had him try and avoid milk for 1 week.  He reports that he was typically having 7 and soft but formed bowel movements a day but when he stopped milk products he only had 1-2 bowel movements a day.  He associates no products to excessive gas also.  When asked why he did not stop the milk he states that it is something he is used to drinking and not to have milk.  He usually drinks milk right before going to sleep. The patient had seen me many years ago for multiple GI complaints that he states improved back then.  He also reports at that time he had  negative blood work for any cause of his symptoms. The patient states that his symptoms are much worse when he is under a lot of stress. 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  Past Medical History:  Diagnosis Date  . Collapsed lung 2017    Past Surgical History:  Procedure Laterality Date  . PLEURAL SCARIFICATION  2015    Prior to Admission medications   Medication Sig Start Date End Date Taking? Authorizing Provider  amitriptyline (ELAVIL) 10 MG tablet Take one at bedtime for one week, then increase to 2  tablets at bedtime 09/07/18   Tanner Limes, MD  Rimegepant Sulfate (NURTEC) 75 MG TBDP Take 1 tablet by mouth as needed for up to 1 dose (for migraine, no more than one tablet within 24 hours.). 11/18/18   Tanner Limes, MD  SUMAtriptan (IMITREX) 100 MG tablet Take one tablet at onset of migraine headache. May take a second tablet if needed after 2 hours 01/24/18   Tanner Limes, MD    Family History  Problem Relation Age of Onset  . Depression Mother   . Depression Father      Social History   Tobacco Use  . Smoking status: Never Smoker  . Smokeless tobacco: Never Used  Substance Use Topics  . Alcohol use: No  . Drug use: No    Allergies as of 12/14/2018  . (No Known Allergies)    Review of Systems:    All systems reviewed and negative except where noted in HPI.   Physical Exam:  There were no vitals taken for this visit. No LMP for male patient. General:   Alert,  Well-developed, well-nourished, pleasant and cooperative in NAD Head:  Normocephalic and atraumatic. Eyes:  Sclera clear, no icterus.   Conjunctiva pink. Ears:  Normal auditory acuity. Nose:  No deformity, discharge, or lesions. Mouth:  No deformity or lesions,oropharynx pink & moist. Neck:  Supple; no masses or thyromegaly. Lungs:  Respirations even and unlabored.  Clear throughout to auscultation.   No wheezes, crackles, or rhonchi. No acute distress. Heart:  Regular rate and rhythm; no murmurs, clicks, rubs, or gallops. Abdomen:  Normal bowel sounds.  No bruits.  Soft, non-tender and non-distended without masses, hepatosplenomegaly or hernias noted.  No guarding or rebound tenderness.  Negative Carnett sign.   Rectal:  Deferred.  Msk:  Symmetrical without gross deformities.  Good, equal movement & strength bilaterally. Pulses:  Normal pulses noted. Extremities:  No clubbing or edema.  No cyanosis. Neurologic:  Alert and oriented x3;  grossly normal neurologically. Skin:  Intact without significant  lesions or rashes.  No jaundice. Lymph Nodes:  No significant cervical adenopathy. Psych:  Alert and cooperative. Normal mood and affect.  Imaging Studies: No results found.  Assessment and Plan:   Tanner Brown is a 23 y.o. y/o male who comes in with diarrhea during the day without any worry symptoms such as a family history of colon cancer, rectal bleeding, unexplained weight loss or stools waking him up from a sound sleep.  With his abdominal cramps and 7 bowel movements a day reported to be worse with stress it is likely that he has some form of irritable bowel syndrome in addition to lactose intolerance since his symptoms get considerably better after stopping milk.  The patient has been told to continue his milk intake but to switch to Lactaid milk and to get Lactaid pills to supplement when he eats cheese or ice cream.  He has also been given samples of fiber to help bulk up his stools and facilitate more complete evacuation.  He was offered hyoscyamine for abdominal cramps but on trying to prescribe this it appears that his insurance does not cover it therefore we will try the previously mentioned interventions prior to any medications at this time.  The patient will also have a IBD blood panel sent off.  The patient has been explained the plan and agrees with it.  Tanner Miniumarren Priscilla Finklea, MD. Clementeen GrahamFACG    Note: This dictation was prepared with Dragon dictation along with smaller phrase technology. Any transcriptional errors that result from this process are unintentional.

## 2018-12-20 LAB — IBD EXPANDED PANEL
ACCA: 42 units (ref 0–90)
ALCA: 18 units (ref 0–60)
AMCA: 84 units (ref 0–100)
Atypical pANCA: NEGATIVE
gASCA: 9 units (ref 0–50)

## 2018-12-22 ENCOUNTER — Telehealth: Payer: Self-pay

## 2018-12-22 NOTE — Telephone Encounter (Signed)
Pt notified of results

## 2018-12-22 NOTE — Telephone Encounter (Signed)
-----   Message from Lucilla Lame, MD sent at 12/19/2018 11:43 AM EDT ----- Let the patient know that so far his inflammatory bowel disease panel is negative.

## 2019-01-03 ENCOUNTER — Ambulatory Visit (INDEPENDENT_AMBULATORY_CARE_PROVIDER_SITE_OTHER): Payer: 59 | Admitting: Physician Assistant

## 2019-01-03 ENCOUNTER — Other Ambulatory Visit: Payer: Self-pay

## 2019-01-03 DIAGNOSIS — Z23 Encounter for immunization: Secondary | ICD-10-CM | POA: Diagnosis not present

## 2019-01-26 ENCOUNTER — Telehealth: Payer: Self-pay | Admitting: Gastroenterology

## 2019-01-26 NOTE — Telephone Encounter (Signed)
Let the patient know that I am sorry to hear that he is having these problems but this is not something that we had addressed since he had come to see me because of diarrhea with abdominal cramps in the past.  I have no point of reference for his new symptoms of trouble eating.

## 2019-01-26 NOTE — Telephone Encounter (Signed)
Patient called in stating he has had trouble for about a week or so when he eats in 30 mins.he has sever stomach pain that last for about an hour or more. This is causing him not to eat. Please advise.

## 2019-01-27 ENCOUNTER — Telehealth: Payer: Self-pay | Admitting: Family Medicine

## 2019-01-27 MED ORDER — PROMETHAZINE HCL 25 MG PO TABS: 12.5000 mg | ORAL_TABLET | Freq: Three times a day (TID) | ORAL | 1 refills | Status: DC | PRN

## 2019-01-27 NOTE — Telephone Encounter (Signed)
Patient is having some nausea and scheduled to be seen on Monday 01/30/2019. Requested phenergan be called in to take over the weekend.

## 2019-01-30 ENCOUNTER — Encounter: Payer: Self-pay | Admitting: Family Medicine

## 2019-01-30 ENCOUNTER — Ambulatory Visit (INDEPENDENT_AMBULATORY_CARE_PROVIDER_SITE_OTHER): Payer: 59 | Admitting: Family Medicine

## 2019-01-30 VITALS — Temp 97.5°F

## 2019-01-30 DIAGNOSIS — R1010 Upper abdominal pain, unspecified: Secondary | ICD-10-CM

## 2019-01-30 NOTE — Progress Notes (Signed)
       Patient: Tanner Brown Male    DOB: June 02, 1996   23 y.o.   MRN: [id number] Visit Date: 01/30/2019  Today's Provider: Lelon Huh, MD   Chief Complaint  Patient presents with  . Nausea    X 10 days   Subjective:    Telephone Visit: Patient location: home Provider location: [place] office  Persons involved in the visit: patient, provider  I connected with  Yaakov Guthrie on 01/30/19 by telephone and verified that I am speaking with the correct person using two identifiers.   I discussed the limitations of evaluation and management by telemedicine. The patient expressed understanding and agreed to proceed.    HPI Nausea: Patient complains of Nausea and abdominal pain for the past 10 days. Symptoms are aggravated when eating certain foods. He also has noticed an increase in reflux. Patient denies any fever, chills or vomiting. Starts it began after drinking Lact-Aid with chocolate. Pain occurs only after eating, but no consistent pattern to the types of food that trigger pain. However he seems to tolerated bland foods best. States pain always occurs in upper abdomen and is crampy. No diarrhea so long as he does not consume lactose. States has not headache in over a month. He did start OTC omeprazole twice a day about 4 or 5 days ago, and states he hasn't had any trouble with stomach at all in about 3 days.    No Known Allergies   Current Outpatient Medications:  .  promethazine (PHENERGAN) 25 MG tablet, Take 0.5-1 tablets (12.5-25 mg total) by mouth every 8 (eight) hours as needed for nausea or vomiting., Disp: 20 tablet, Rfl: 1 .  Rimegepant Sulfate (NURTEC) 75 MG TBDP, Take 1 tablet by mouth as needed for up to 1 dose (for migraine, no more than one tablet within 24 hours.)., Disp: 16 tablet, Rfl: 3 .  SUMAtriptan (IMITREX) 100 MG tablet, Take one tablet at onset of migraine headache. May take a second tablet if needed after 2 hours, Disp: 10  tablet, Rfl: 3 .  amitriptyline (ELAVIL) 10 MG tablet, Take one at bedtime for one week, then increase to 2 tablets at bedtime (Patient not taking: Reported on 01/30/2019), Disp: 60 tablet, Rfl: 1  Review of Systems  Constitutional: Negative for appetite change, chills and fever.  Respiratory: Negative for chest tightness, shortness of breath and wheezing.   Cardiovascular: Negative for chest pain and palpitations.  Gastrointestinal: Positive for abdominal pain and nausea. Negative for constipation, diarrhea and vomiting.    Social History   Tobacco Use  . Smoking status: Never Smoker  . Smokeless tobacco: Never Used  Substance Use Topics  . Alcohol use: No      Objective:   Temp (!) 97.5 F (36.4 C) (Oral)  Vitals:   01/30/19 1104  Temp: (!) 97.5 F (36.4 C)  TempSrc: Oral     Physical Exam   General :    Alert, cooperative, no distress    No other exam due to being telephone visit.        Assessment & Plan    1. Pain of upper abdomen Likely gastritis as it is always postprandial, only affects upper abdomen and has resolved since starting PPI.  - H. pylori breath test - Amylase - Comprehensive metabolic panel     Lelon Huh, MD  [facility]

## 2019-01-30 NOTE — Patient Instructions (Signed)
.   Please review the attached list of medications and notify my office if there are any errors.   . Please bring all of your medications to every appointment so we can make sure that our medication list is the same as yours.   . We will have flu vaccines available after Labor Day. Please go to your pharmacy or call the office in early September to schedule you flu shot.   

## 2019-01-31 DIAGNOSIS — R1010 Upper abdominal pain, unspecified: Secondary | ICD-10-CM | POA: Diagnosis not present

## 2019-02-01 LAB — AMYLASE: Amylase: 49 U/L (ref 31–110)

## 2019-02-01 LAB — COMPREHENSIVE METABOLIC PANEL
ALT: 13 IU/L (ref 0–44)
AST: 13 IU/L (ref 0–40)
Albumin/Globulin Ratio: 2 (ref 1.2–2.2)
Albumin: 4.5 g/dL (ref 4.1–5.2)
Alkaline Phosphatase: 74 IU/L (ref 39–117)
BUN/Creatinine Ratio: 8 — ABNORMAL LOW (ref 9–20)
BUN: 10 mg/dL (ref 6–20)
Bilirubin Total: 0.7 mg/dL (ref 0.0–1.2)
CO2: 24 mmol/L (ref 20–29)
Calcium: 9.5 mg/dL (ref 8.7–10.2)
Chloride: 103 mmol/L (ref 96–106)
Creatinine, Ser: 1.19 mg/dL (ref 0.76–1.27)
GFR calc Af Amer: 100 mL/min/{1.73_m2} (ref >59)
GFR calc non Af Amer: 86 mL/min/{1.73_m2} (ref >59)
Globulin, Total: 2.3 g/dL (ref 1.5–4.5)
Glucose: 107 mg/dL — ABNORMAL HIGH (ref 65–99)
Potassium: 4 mmol/L (ref 3.5–5.2)
Sodium: 142 mmol/L (ref 134–144)
Total Protein: 6.8 g/dL (ref 6.0–8.5)

## 2019-02-01 LAB — H. PYLORI BREATH TEST: H pylori Breath Test: NEGATIVE

## 2019-02-03 ENCOUNTER — Telehealth: Payer: Self-pay

## 2019-02-03 NOTE — Telephone Encounter (Signed)
-----   Message from Birdie Sons, MD sent at 02/03/2019  2:28 PM EDT ----- H. Pylori, pancreatic, liver and gallbladder functions are all normal. I think he just has gastritis or IBS which should improve with OTC proton pump inhibitor (I think he has been taking Prilosec or Nexium)  If stomach is doing better then should just stay on the OTC medication for a few months. If pain has returned I would recommend getting back on amitriptyline for IBS. If he's out we can send refill to his pharmacy.

## 2019-02-03 NOTE — Telephone Encounter (Signed)
Patient advised and verbally voiced understanding. Patient agrees to stay on OTC PPI for a few months.

## 2019-03-03 ENCOUNTER — Other Ambulatory Visit: Payer: Self-pay | Admitting: Family Medicine

## 2019-04-02 DIAGNOSIS — Z1159 Encounter for screening for other viral diseases: Secondary | ICD-10-CM | POA: Diagnosis not present

## 2019-06-02 DIAGNOSIS — H5213 Myopia, bilateral: Secondary | ICD-10-CM | POA: Diagnosis not present

## 2019-07-27 ENCOUNTER — Ambulatory Visit: Payer: 59 | Attending: Internal Medicine

## 2019-07-27 DIAGNOSIS — Z20822 Contact with and (suspected) exposure to covid-19: Secondary | ICD-10-CM | POA: Diagnosis not present

## 2019-07-28 LAB — NOVEL CORONAVIRUS, NAA: SARS-CoV-2, NAA: NOT DETECTED

## 2019-08-02 ENCOUNTER — Ambulatory Visit: Payer: 59 | Attending: Internal Medicine

## 2019-08-02 DIAGNOSIS — Z20822 Contact with and (suspected) exposure to covid-19: Secondary | ICD-10-CM | POA: Diagnosis not present

## 2019-08-03 LAB — NOVEL CORONAVIRUS, NAA: SARS-CoV-2, NAA: DETECTED — AB

## 2020-01-09 NOTE — Progress Notes (Signed)
     Established patient visit   Patient: Tanner Brown   DOB: Jul 02, 1996   24 y.o. Male  MRN: [id number] Visit Date: 01/10/2020  Today's healthcare provider: Mila Merry, MD   Chief Complaint  Patient presents with  . Anxiety   Velora Mediate as a scribe for Mila Merry, MD.,have documented all relevant documentation on the behalf of Mila Merry, MD,as directed by  Mila Merry, MD while in the presence of Mila Merry, MD.  Subjective    Anxiety Presents for follow-up visit. Symptoms include depressed mood, excessive worry, insomnia, nervous/anxious behavior and panic. Patient reports no chest pain, nausea, palpitations or shortness of breath. Symptoms occur most days. The severity of symptoms is moderate. The quality of sleep is poor. Nighttime awakenings: several.    Has been very busy with school, working and recently engaged. He feels like sertraline has helped somewhat, but not adequately controlling his anxiety  He also states he found a very hard sharp edged painless mass in scrotum a few weeks ago. It is not attached to either testicle and seems to sit between them. He states it is much more noticeable when he is sitting.      Medications: Outpatient Medications Prior to Visit  Medication Sig  . Rimegepant Sulfate (NURTEC) 75 MG TBDP Take 1 tablet by mouth as needed for up to 1 dose (for migraine, no more than one tablet within 24 hours.).  Marland Kitchen sertraline (ZOLOFT) 50 MG tablet Take 50 mg by mouth daily.  . SUMAtriptan (IMITREX) 100 MG tablet TAKE ONE TABLET AT ONSET OF MIGRAINE HEADACHE. MAY TAKE A SECOND TABLET IF NEEDED AFTER 2 HOURS  . [DISCONTINUED] amitriptyline (ELAVIL) 10 MG tablet Take one at bedtime for one week, then increase to 2 tablets at bedtime (Patient not taking: Reported on 01/30/2019)  . [DISCONTINUED] promethazine (PHENERGAN) 25 MG tablet Take 0.5-1 tablets (12.5-25 mg total) by mouth every 8 (eight) hours as needed for nausea or  vomiting.   No facility-administered medications prior to visit.    Review of Systems  Constitutional: Negative for appetite change, chills and fever.  Respiratory: Negative for chest tightness, shortness of breath and wheezing.   Cardiovascular: Negative for chest pain and palpitations.  Gastrointestinal: Negative for abdominal pain, nausea and vomiting.  Psychiatric/Behavioral: The patient is nervous/anxious and has insomnia.       Objective    BP 113/70 (BP Location: Right Arm, Patient Position: Sitting, Cuff Size: Normal)   Pulse 65   Temp (!) 96.6 F (35.9 C) (Temporal)   Wt 187 lb (84.8 kg)   BMI 22.76 kg/m    Physical Exam   General appearance: Well developed, well nourished male, cooperative and in no acute distress Head: Normocephalic, without obvious abnormality, atraumatic Respiratory: Respirations even and unlabored, normal respiratory rate Extremities: All extremities are intact.  GU:  Testicles non-tender with no masses. Small hard vague scrotal mass near pubic symphysis.    No results found for any visits on 01/10/20.  Assessment & Plan     1. Anxiety Tolerating sertraline well, but minimally effective at current dose. Will titrate up to- sertraline (ZOLOFT) 100 MG tablet; Take 1 tablet (100 mg total) by mouth daily.  Dispense: 30 tablet; Refill: 1 Follow up in 4 weeks.   2. Scrotal mass  - US SCROTUM W/DOPPLER; Future            Mila Merry, MD  [facility] [phone] (phone) [phone] (fax)  [facility]

## 2020-01-10 ENCOUNTER — Encounter: Payer: Self-pay | Admitting: Family Medicine

## 2020-01-10 ENCOUNTER — Ambulatory Visit (INDEPENDENT_AMBULATORY_CARE_PROVIDER_SITE_OTHER): Payer: 59 | Admitting: Family Medicine

## 2020-01-10 ENCOUNTER — Other Ambulatory Visit: Payer: Self-pay

## 2020-01-10 VITALS — BP 113/70 | HR 65 | Temp 96.6°F | Wt 187.0 lb

## 2020-01-10 DIAGNOSIS — N5089 Other specified disorders of the male genital organs: Secondary | ICD-10-CM | POA: Diagnosis not present

## 2020-01-10 DIAGNOSIS — F419 Anxiety disorder, unspecified: Secondary | ICD-10-CM | POA: Diagnosis not present

## 2020-01-10 MED ORDER — SERTRALINE HCL 100 MG PO TABS: 100.0000 mg | ORAL_TABLET | Freq: Every day | ORAL | 1 refills | Status: DC

## 2020-01-10 NOTE — Patient Instructions (Signed)
.   Take 1 1/2 tablet sertraline until the 50mg  tablets are gone, then change to 1 100mg  tablet daily

## 2020-01-16 ENCOUNTER — Ambulatory Visit
Admission: RE | Admit: 2020-01-16 | Discharge: 2020-01-16 | Disposition: A | Discharge status: Home / Self Care | Payer: 59 | Source: Ambulatory Visit | Attending: Family Medicine | Admitting: Family Medicine

## 2020-01-16 ENCOUNTER — Other Ambulatory Visit: Payer: Self-pay

## 2020-01-16 DIAGNOSIS — N5089 Other specified disorders of the male genital organs: Secondary | ICD-10-CM | POA: Insufficient documentation

## 2020-01-16 DIAGNOSIS — N281 Cyst of kidney, acquired: Secondary | ICD-10-CM | POA: Diagnosis not present

## 2020-01-16 DIAGNOSIS — I861 Scrotal varices: Secondary | ICD-10-CM | POA: Diagnosis not present

## 2020-01-16 DIAGNOSIS — N503 Cyst of epididymis: Secondary | ICD-10-CM | POA: Diagnosis not present

## 2020-02-07 NOTE — Progress Notes (Signed)
I,Roshena L Chambers,acting as a scribe for Mila Merry, MD.,have documented all relevant documentation on the behalf of Mila Merry, MD,as directed by  Mila Merry, MD while in the presence of Mila Merry, MD.  Established patient visit   Patient: Tanner Brown   DOB: 06/27/1996   24 y.o. Male  MRN: [id number] Visit Date: 02/09/2020  Today's healthcare provider: Mila Merry, MD   Chief Complaint  Patient presents with  . Anxiety   Subjective    HPI  Anxiety, Follow-up  He was last seen for anxiety 1 month ago. Changes made at last visit include titrating Sertraline up to 100mg  daily.   He reports good compliance with treatment. He reports good tolerance of treatment. He is not having side effects.   He feels his anxiety is moderate and Unchanged since last visit.  Symptoms: No chest pain Yes difficulty concentrating  No dizziness No fatigue  No feelings of losing control Yes insomnia  No irritable No palpitations  No panic attacks No racing thoughts  No shortness of breath No sweating  No tremors/shakes    GAD-7 Results GAD-7 Generalized Anxiety Disorder Screening Tool 02/09/2020  1. Feeling Nervous, Anxious, or on Edge 3  2. Not Being Able to Stop or Control Worrying 2  3. Worrying Too Much About Different Things 3  4. Trouble Relaxing 3  5. Being So Restless it's Hard To Sit Still 3  6. Becoming Easily Annoyed or Irritable 1  7. Feeling Afraid As If Something Awful Might Happen 2  Total GAD-7 Score 17  Difficulty At Work, Home, or Getting  Along With Others? Somewhat difficult   PHQ-9 Scores PHQ9 SCORE ONLY 01/10/2020 01/24/2018  PHQ-9 Total Score 0 3   He is tolerating increased dose of sertraline well, but hasn't noticed significant change in anxiety level. However he prefers not to change medications yet as anxiety is usually situational and is not having significant negative impact on ADLs.  He does state he ran out of Nurtec a few weeks  ago and has been having more migraines. He had been taking it every other day for migraine prevention and it was working well.   He states that his GI symptoms have improved since getting back on a healthier diet a few weeks ago.  ---------------------------------------------------------------------------------------------------    Medications: Outpatient Medications Prior to Visit  Medication Sig  . Rimegepant Sulfate (NURTEC) 75 MG TBDP Take 1 tablet by mouth as needed for up to 1 dose (for migraine, no more than one tablet within 24 hours.).  01/26/2018 sertraline (ZOLOFT) 100 MG tablet Take 1 tablet (100 mg total) by mouth daily.  . SUMAtriptan (IMITREX) 100 MG tablet TAKE ONE TABLET AT ONSET OF MIGRAINE HEADACHE. MAY TAKE A SECOND TABLET IF NEEDED AFTER 2 HOURS   No facility-administered medications prior to visit.    Review of Systems   Objective    BP 110/70 (BP Location: Left Arm, Patient Position: Sitting, Cuff Size: Normal)   Pulse 65   Temp 97.8 F (36.6 C) (Temporal)   Resp 16   Wt 187 lb (84.8 kg)   SpO2 98% Comment: room air  BMI 22.76 kg/m    Physical Exam  General appearance: Well developed, well nourished male, cooperative and in no acute distress Head: Normocephalic, without obvious abnormality, atraumatic Respiratory: Respirations even and unlabored, normal respiratory rate Extremities: All extremities are intact.  Skin: Skin color, texture, turgor normal. No rashes seen  Psych: Appropriate mood and affect.  Neurologic: Mental status: Alert, oriented to person, place, and time, thought content appropriate.  No results found for any visits on 02/09/20.  Assessment & Plan     1. Anxiety Stable on current dose of sertraline which he is tolerating well. Is mostly situational. He is started back at [employer] in August to finish his last semester. Discussed options of increasing sertraline versus adding another medication such as buspirone. He is planning on getting back on  a regular exercise routine which usually helps. He feels his current level of anxiety is managable and prefers to continue current medications, and reassess in 3 months.   2. Migraine without aura and responsive to treatment Was doing well before running out of Nurtec which he was taking for prophylaxis. Refill sent today.   - Rimegepant Sulfate (NURTEC) 75 MG TBDP; Take 1 tablet by mouth every other day.  Dispense: 30 tablet; Refill: 3   3. Lactose intolerance Has recently gotten back on diet and is currently well  Controlled.    Return in about 3 months (around 05/11/2020).      The entirety of the information documented in the History of Present Illness, Review of Systems and Physical Exam were personally obtained by me. Portions of this information were initially documented by the CMA and reviewed by me for thoroughness and accuracy.      Mila Merry, MD  [facility] [phone] (phone) [phone] (fax)  [facility]

## 2020-02-09 ENCOUNTER — Other Ambulatory Visit: Payer: Self-pay

## 2020-02-09 ENCOUNTER — Ambulatory Visit (INDEPENDENT_AMBULATORY_CARE_PROVIDER_SITE_OTHER): Payer: 59 | Admitting: Family Medicine

## 2020-02-09 ENCOUNTER — Encounter: Payer: Self-pay | Admitting: Family Medicine

## 2020-02-09 VITALS — BP 110/70 | HR 65 | Temp 97.8°F | Resp 16 | Wt 187.0 lb

## 2020-02-09 DIAGNOSIS — G43009 Migraine without aura, not intractable, without status migrainosus: Secondary | ICD-10-CM | POA: Diagnosis not present

## 2020-02-09 DIAGNOSIS — F419 Anxiety disorder, unspecified: Secondary | ICD-10-CM

## 2020-02-09 DIAGNOSIS — Z8616 Personal history of COVID-19: Secondary | ICD-10-CM | POA: Insufficient documentation

## 2020-02-09 DIAGNOSIS — E739 Lactose intolerance, unspecified: Secondary | ICD-10-CM | POA: Diagnosis not present

## 2020-02-09 MED ORDER — NURTEC 75 MG PO TBDP: 1.0000 | ORAL_TABLET | ORAL | 3 refills | Status: DC

## 2020-02-09 NOTE — Patient Instructions (Signed)
.   Please review the attached list of medications and notify my office if there are any errors.   . Please bring all of your medications to every appointment so we can make sure that our medication list is the same as yours.   

## 2020-07-03 ENCOUNTER — Other Ambulatory Visit: Payer: Self-pay | Admitting: Physician Assistant

## 2020-07-03 DIAGNOSIS — J029 Acute pharyngitis, unspecified: Secondary | ICD-10-CM

## 2020-07-03 DIAGNOSIS — R059 Cough, unspecified: Secondary | ICD-10-CM

## 2020-07-03 DIAGNOSIS — R509 Fever, unspecified: Secondary | ICD-10-CM

## 2020-07-03 NOTE — Progress Notes (Signed)
covid test ordered

## 2020-07-04 DIAGNOSIS — J029 Acute pharyngitis, unspecified: Secondary | ICD-10-CM | POA: Diagnosis not present

## 2020-07-04 DIAGNOSIS — R059 Cough, unspecified: Secondary | ICD-10-CM | POA: Diagnosis not present

## 2020-07-04 DIAGNOSIS — R509 Fever, unspecified: Secondary | ICD-10-CM | POA: Diagnosis not present

## 2020-07-06 LAB — NOVEL CORONAVIRUS, NAA: SARS-CoV-2, NAA: NOT DETECTED

## 2020-07-06 LAB — SARS-COV-2, NAA 2 DAY TAT

## 2020-08-08 ENCOUNTER — Other Ambulatory Visit: Payer: Self-pay

## 2020-08-08 DIAGNOSIS — R519 Headache, unspecified: Secondary | ICD-10-CM | POA: Diagnosis not present

## 2020-08-10 LAB — COVID-19, FLU A+B AND RSV
Influenza A, NAA: NOT DETECTED
Influenza B, NAA: NOT DETECTED
RSV, NAA: NOT DETECTED
SARS-CoV-2, NAA: NOT DETECTED

## 2020-09-18 ENCOUNTER — Encounter: Payer: Self-pay | Admitting: Family Medicine

## 2020-09-18 ENCOUNTER — Ambulatory Visit: Payer: 59 | Admitting: Family Medicine

## 2020-09-18 ENCOUNTER — Other Ambulatory Visit (HOSPITAL_COMMUNITY): Payer: Self-pay | Admitting: Family Medicine

## 2020-09-18 ENCOUNTER — Other Ambulatory Visit: Payer: Self-pay

## 2020-09-18 VITALS — BP 110/74 | HR 77 | Resp 18 | Wt 190.0 lb

## 2020-09-18 DIAGNOSIS — F988 Other specified behavioral and emotional disorders with onset usually occurring in childhood and adolescence: Secondary | ICD-10-CM | POA: Diagnosis not present

## 2020-09-18 MED ORDER — SUMATRIPTAN SUCCINATE 100 MG PO TABS: ORAL_TABLET | ORAL | 3 refills | Status: AC

## 2020-09-18 MED ORDER — AMPHETAMINE-DEXTROAMPHETAMINE 10 MG PO TABS: 10.0000 mg | ORAL_TABLET | Freq: Two times a day (BID) | ORAL | 0 refills | Status: DC

## 2020-09-18 NOTE — Progress Notes (Signed)
Established patient visit   Patient: Tanner Brown   DOB: 07/07/1996   25 y.o. Male  MRN: [id number] Visit Date: 09/18/2020  Today's healthcare provider: Mila Merry, MD   Chief Complaint  Patient presents with  . Anxiety  . ADD   Subjective    HPI  Follow up for ADD:  The patient was last seen for this 6 years ago (seen by Dr. Elease Hashimoto). He has several upcoming test for work and he states he needs something to help him focus. He has been working at [employer] but still needs to take his licensing exams. He is also engaged to be married in June. He is having trouble staying focused to study for his exams. He was diagnosed with ADD in High School and prescribed Ritalin LA  20mg , but he stopped taking it because it made him feel tired and isn't really sure if it helped with his focus or not. He did not take ADD medication in college but still did very well.   GAD-7 Results GAD-7 Generalized Anxiety Disorder Screening Tool 02/09/2020  1. Feeling Nervous, Anxious, or on Edge 3  2. Not Being Able to Stop or Control Worrying 2  3. Worrying Too Much About Different Things 3  4. Trouble Relaxing 3  5. Being So Restless it's Hard To Sit Still 3  6. Becoming Easily Annoyed or Irritable 1  7. Feeling Afraid As If Something Awful Might Happen 2  Total GAD-7 Score 17  Difficulty At Work, Home, or Getting  Along With Others? Somewhat difficult    PHQ-9 Scores PHQ9 SCORE ONLY 01/10/2020 01/24/2018  PHQ-9 Total Score 0 3    ---------------------------------------------------------------------------------------------------      Medications: Outpatient Medications Prior to Visit  Medication Sig  . Rimegepant Sulfate (NURTEC) 75 MG TBDP Take 1 tablet by mouth every other day.  . SUMAtriptan (IMITREX) 100 MG tablet TAKE ONE TABLET AT ONSET OF MIGRAINE HEADACHE. MAY TAKE A SECOND TABLET IF NEEDED AFTER 2 HOURS  . sertraline (ZOLOFT) 100 MG tablet Take 1 tablet (100 mg  total) by mouth daily. (Patient not taking: Reported on 09/18/2020)   No facility-administered medications prior to visit.    Review of Systems  Constitutional: Negative for appetite change, chills and fever.  Respiratory: Negative for chest tightness, shortness of breath and wheezing.   Cardiovascular: Negative for chest pain and palpitations.  Gastrointestinal: Negative for abdominal pain, nausea and vomiting.       Objective    BP 110/74 (BP Location: Left Arm, Patient Position: Sitting, Cuff Size: Normal)   Pulse 77   Resp 18   Wt 190 lb (86.2 kg)   BMI 23.13 kg/m     Physical Exam   General appearance: Well developed, well nourished male, cooperative and in no acute distress Head: Normocephalic, without obvious abnormality, atraumatic Respiratory: Respirations even and unlabored, normal respiratory rate Extremities: All extremities are intact.  Skin: Skin color, texture, turgor normal. No rashes seen  Psych: Appropriate mood and affect. Neurologic: Mental status: Alert, oriented to person, place, and time, thought content appropriate.    Assessment & Plan     1. Attention deficit disorder (ADD) without hyperactivity diagnosed in high school but doesn't seem to think Ritalin LA prescribed back then was very helpful. No having trouble focusing for licensing exams required for his job. Will try amphetamine-dextroamphetamine (ADDERALL) 10 MG tablet; Take 1 tablet (10 mg total) by mouth 2 (two) times daily.  Dispense: 60 tablet;  Refill: 0  He is to call if any problems with medication.   Refill - SUMAtriptan (IMITREX) 100 MG tablet; May repeat in 2 hours if headache persists or recurs.  Dispense: 10 tablet; Refill: 3        The entirety of the information documented in the History of Present Illness, Review of Systems and Physical Exam were personally obtained by me. Portions of this information were initially documented by the CMA and reviewed by me for thoroughness and  accuracy.      Mila Merry, MD  [facility] [phone] (phone) [phone] (fax)  [facility]

## 2020-12-04 DIAGNOSIS — Z79899 Other long term (current) drug therapy: Secondary | ICD-10-CM | POA: Diagnosis not present

## 2020-12-04 DIAGNOSIS — E538 Deficiency of other specified B group vitamins: Secondary | ICD-10-CM | POA: Diagnosis not present

## 2020-12-04 DIAGNOSIS — E559 Vitamin D deficiency, unspecified: Secondary | ICD-10-CM | POA: Diagnosis not present

## 2020-12-06 ENCOUNTER — Other Ambulatory Visit: Payer: Self-pay

## 2021-08-06 ENCOUNTER — Telehealth: Payer: Self-pay | Admitting: Family Medicine

## 2021-08-06 NOTE — Telephone Encounter (Signed)
Caller name: Anderson Malta  Relation to pt: caller [place]  Call back number: [phone] reference  [place]   Reason for call:  Cover my Meds faxed over on 07/28/2021 and 07/30/2021  regarding PA for  Rimegepant Sulfate (NURTEC) 75 MG TBDP. Caller states patient insurance that is on file [employer] does not cover medication and theres indication that patient has additional insurance. Faxing over PA form again to [phone], please note when received.

## 2021-10-20 ENCOUNTER — Ambulatory Visit: Payer: 59 | Admitting: Family Medicine

## 2021-10-27 ENCOUNTER — Ambulatory Visit (INDEPENDENT_AMBULATORY_CARE_PROVIDER_SITE_OTHER): Payer: Commercial Managed Care - PPO | Admitting: Family Medicine

## 2021-10-27 ENCOUNTER — Other Ambulatory Visit: Payer: Self-pay

## 2021-10-27 ENCOUNTER — Encounter: Payer: Self-pay | Admitting: Family Medicine

## 2021-10-27 DIAGNOSIS — F988 Other specified behavioral and emotional disorders with onset usually occurring in childhood and adolescence: Secondary | ICD-10-CM

## 2021-10-27 DIAGNOSIS — G43009 Migraine without aura, not intractable, without status migrainosus: Secondary | ICD-10-CM

## 2021-10-27 MED ORDER — AMPHETAMINE-DEXTROAMPHETAMINE 10 MG PO TABS
10.0000 mg | ORAL_TABLET | Freq: Two times a day (BID) | ORAL | 0 refills | Status: DC
  Filled 2021-10-27 – 2021-10-28 (×2): qty 60, 30d supply, fill #0

## 2021-10-27 MED ORDER — NURTEC 75 MG PO TBDP
1.0000 | ORAL_TABLET | ORAL | 5 refills | Status: DC
  Filled 2021-10-27: qty 16, 30d supply, fill #0
  Filled 2022-03-30: qty 16, 30d supply, fill #1
  Filled 2022-04-25: qty 16, 30d supply, fill #2
  Filled 2022-08-19: qty 16, 30d supply, fill #3

## 2021-10-27 NOTE — Progress Notes (Signed)
?  ? ?I,Roshena L Chambers,acting as a scribe for Lelon Huh, MD.,have documented all relevant documentation on the behalf of Lelon Huh, MD,as directed by  Lelon Huh, MD while in the presence of Lelon Huh, MD.  ? ?Established patient visit ? ? ?Patient: Tanner Brown   DOB: 15-Apr-1996   26 y.o. Male  MRN: [id number] ?Visit Date: 10/27/2021 ? ?Today's healthcare provider: Lelon Huh, MD  ? ?Chief Complaint  ?Patient presents with  ? ADD  ? ?Subjective  ?  ?HPI ?Follow up for ADD: ? ?The patient was last seen for this 1  year  ago. ?Changes made at last visit include trying amphetamine-dextroamphetamine (ADDERALL) 10 MG tablet; Take 1 tablet (10 mg total) by mouth 2 (two) times daily. ? ?He reports good compliance with treatment. ?He feels that condition is Improved. ?He is having side effects. 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 He only takes Nurtec prn, usually when studying for exams. He is currently taking since he is studying for [employer] exam.  ?-----------------------------------------------------------------------------------------  ? ?Follow up for headaches: ? ?The patient was last seen for this 1  year  ago. ?Changes made at last visit include none; continue same medication. ? ?He reports good compliance with treatment. ?He feels that condition is Unchanged. ?He is not having side effects.  ? ?Has been on topiramate in the past which was not effective, Nurtec has been the only medication that consistently stops his migraines. He takes it prn, but has been having headaches about 3 times every week lately. He rarely takes sumatriptan if he is running low on Nurtec, but it is not  as effective.  ? ?-----------------------------------------------------------------------------------------  ? ?  10/27/2021  ?  9:58 AM 09/18/2020  ?  8:41 AM 01/10/2020  ? 10:50 AM  ?Depression screen PHQ 2/9  ?Decreased Interest 0 0 0  ?Down, Depressed, Hopeless 1 1 0  ?PHQ - 2 Score 1 1 0   ?Altered sleeping 1 1   ?Tired, decreased energy 1 2   ?Change in appetite 0 0   ?Feeling bad or failure about yourself  0 0   ?Trouble concentrating 1 3   ?Moving slowly or fidgety/restless 0 0   ?Suicidal thoughts 0 0   ?PHQ-9 Score 4 7   ?Difficult doing work/chores Not difficult at all Somewhat difficult   ?  ? ?Medications: ?Outpatient Medications Prior to Visit  ?Medication Sig  ? amphetamine-dextroamphetamine (ADDERALL) 10 MG tablet Take 1 tablet (10 mg total) by mouth 2 (two) times daily.  ? Rimegepant Sulfate (NURTEC) 75 MG TBDP Take 1 tablet by mouth every other day.  ? SUMAtriptan (IMITREX) 100 MG tablet May repeat in 2 hours if headache persists or recurs.  ? ?No facility-administered medications prior to visit.  ? ? ?Review of Systems  ?Constitutional:  Negative for appetite change, chills and fever.  ?Respiratory:  Negative for chest tightness, shortness of breath and wheezing.   ?Cardiovascular:  Negative for chest pain and palpitations.  ?Gastrointestinal:  Negative for abdominal pain, nausea and vomiting.  ? ? ?  Objective  ?  ?BP 108/69 (BP Location: Left Arm, Patient Position: Sitting, Cuff Size: Normal)   Pulse 77   Temp 97.6 ?F (36.4 ?C) (Oral)   Resp 14   Ht 6\' 4"  (1.93 m)   Wt 199 lb (90.3 kg)   SpO2 97% Comment: room air  BMI 24.22 kg/m?  ? ? ?Physical Exam  ? ? ?General: Appearance:  Well developed, well nourished male in no acute distress  ?Eyes:    PERRL, conjunctiva/corneas clear, EOM's intact       ?Lungs:     Clear to auscultation bilaterally, respirations unlabored  ?Heart:    Normal heart rate. Normal rhythm. No murmurs, rubs, or gallops.    ?MS:   All extremities are intact.    ?Neurologic:   Awake, alert, oriented x 3. No apparent focal neurological defect.   ?   ?  ? Assessment & Plan  ?  ? ?1. Migraine without aura and responsive to treatment ?Nurtec remains the only consistently effective abortive medication for him, but he is having to take it for headaches about 3  times a week. Recommended that he take it on a schedule as a prophylactic medication. Refilled Rimegepant Sulfate (NURTEC) 75 MG TBDP; Take 1 tablet by mouth every other day.  Dispense: 30 tablet; Refill: 5 ? ?2. Attention deficit disorder (ADD) without hyperactivity ?Doing well taking amphetamine-dextroamphetamine (ADDERALL) 10 MG tablet; Take 1 tablet (10 mg total) by mouth 2 (two) times daily.  Dispense: 60 tablet; Refill: 0 occasionally prn.  ?   ? ?The entirety of the information documented in the History of Present Illness, Review of Systems and Physical Exam were personally obtained by me. Portions of this information were initially documented by the CMA and reviewed by me for thoroughness and accuracy.   ? ? ?Lelon Huh, MD  ?[facility] ?[phone] (phone) ?[phone] (fax) ? ?[facility] ?

## 2021-10-28 ENCOUNTER — Other Ambulatory Visit: Payer: Self-pay

## 2021-10-31 ENCOUNTER — Other Ambulatory Visit: Payer: Self-pay

## 2022-03-30 ENCOUNTER — Other Ambulatory Visit: Payer: Self-pay

## 2022-04-25 ENCOUNTER — Other Ambulatory Visit (HOSPITAL_COMMUNITY): Payer: Self-pay

## 2022-04-25 ENCOUNTER — Other Ambulatory Visit: Payer: Self-pay | Admitting: Family Medicine

## 2022-04-25 DIAGNOSIS — F988 Other specified behavioral and emotional disorders with onset usually occurring in childhood and adolescence: Secondary | ICD-10-CM

## 2022-04-26 MED ORDER — AMPHETAMINE-DEXTROAMPHETAMINE 10 MG PO TABS
10.0000 mg | ORAL_TABLET | Freq: Two times a day (BID) | ORAL | 0 refills | Status: AC
  Filled 2022-04-26: qty 60, 30d supply, fill #0

## 2022-04-27 ENCOUNTER — Other Ambulatory Visit (HOSPITAL_COMMUNITY): Payer: Self-pay

## 2022-04-28 ENCOUNTER — Encounter (HOSPITAL_COMMUNITY): Payer: Self-pay

## 2022-04-28 ENCOUNTER — Other Ambulatory Visit (HOSPITAL_COMMUNITY): Payer: Self-pay

## 2022-05-25 ENCOUNTER — Ambulatory Visit (INDEPENDENT_AMBULATORY_CARE_PROVIDER_SITE_OTHER): Payer: Commercial Managed Care - PPO | Admitting: Family Medicine

## 2022-05-25 ENCOUNTER — Other Ambulatory Visit: Payer: Self-pay

## 2022-05-25 ENCOUNTER — Encounter: Payer: Self-pay | Admitting: Family Medicine

## 2022-05-25 ENCOUNTER — Ambulatory Visit
Admission: RE | Admit: 2022-05-25 | Discharge: 2022-05-25 | Disposition: A | Discharge status: Home / Self Care | Payer: Commercial Managed Care - PPO | Source: Ambulatory Visit | Attending: Family Medicine | Admitting: Family Medicine

## 2022-05-25 ENCOUNTER — Ambulatory Visit
Admission: RE | Admit: 2022-05-25 | Discharge: 2022-05-25 | Disposition: A | Discharge status: Home / Self Care | Payer: Commercial Managed Care - PPO | Attending: Family Medicine | Admitting: Family Medicine

## 2022-05-25 VITALS — BP 106/73 | HR 58 | Temp 97.7°F | Resp 12 | Wt 191.0 lb

## 2022-05-25 DIAGNOSIS — R071 Chest pain on breathing: Secondary | ICD-10-CM | POA: Diagnosis present

## 2022-05-25 MED ORDER — PREDNISONE 10 MG PO TABS
ORAL_TABLET | ORAL | 0 refills | Status: AC
  Filled 2022-05-25: qty 30, 12d supply, fill #0

## 2022-05-25 NOTE — Progress Notes (Unsigned)
      I,Roshena L Chambers,acting as a scribe for Mila Merry, MD.,have documented all relevant documentation on the behalf of Mila Merry, MD,as directed by  Mila Merry, MD while in the presence of Mila Merry, MD.   Established patient visit   Patient: Tanner Brown   DOB: 1996-02-10   26 y.o. Male  MRN: [id number] Visit Date: 05/25/2022  Today's healthcare provider: Mila Merry, MD   Chief Complaint  Patient presents with   Chest Pain   Subjective    Chest Pain  This is a new problem. Episode onset: 5 days ago. The problem has been gradually improving. Pain location: right side of chest. The pain is aggravated by breathing (deep breathing).   Had prednisone at home at started about 6 tablets 2 days ago and 5 tablet yesterday and states pain has gone from 6/10 to 3/10 since then. Dyspnea only when attempting to take deep breath which triggers pain. He does have remote history of bilateral pneumothorax.   Medications: Outpatient Medications Prior to Visit  Medication Sig   amphetamine-dextroamphetamine (ADDERALL) 10 MG tablet Take 1 tablet (10 mg total) by mouth 2 (two) times daily.   Rimegepant Sulfate (NURTEC) 75 MG TBDP Take 1 tablet by mouth every other day.   SUMAtriptan (IMITREX) 100 MG tablet May repeat in 2 hours if headache persists or recurs. (Patient not taking: Reported on 05/25/2022)   No facility-administered medications prior to visit.    Review of Systems  Cardiovascular:  Positive for chest pain.    {Labs  Heme  Chem  Endocrine  Serology  Results Review (optional):23779}   Objective    BP 106/73 (BP Location: Left Arm, Patient Position: Sitting, Cuff Size: Normal)   Pulse (!) 58   Temp 97.7 F (36.5 C) (Oral)   Resp 12   Wt 191 lb (86.6 kg)   SpO2 100% Comment: room air  BMI 23.25 kg/m  {Show previous vital signs (optional):23777}  Physical Exam   General: Appearance:    Well developed, well nourished male in no acute distress   Eyes:    PERRL, conjunctiva/corneas clear, EOM's intact       Lungs:     Clear to auscultation bilaterally, respirations unlabored  Heart:    Bradycardic. Normal rhythm. No murmurs, rubs, or gallops.    MS:   All extremities are intact.    Neurologic:   Awake, alert, oriented x 3. No apparent focal neurological defect.         Assessment & Plan     1. Chest pain on breathing Is most consistent with pleurisy, has significantly improved since starting prednisone 2 days ago. He does have history bilateral pneumothorax 2015. - DG Chest 2 View; Future - predniSONE (DELTASONE) 10 MG tablet; 4 tablets  for 3 days, then 3 for 3 days, then 2 for 3 days, then 1 for 3 days.  Dispense: 30 tablet; Refill: 0      The entirety of the information documented in the History of Present Illness, Review of Systems and Physical Exam were personally obtained by me. Portions of this information were initially documented by the CMA and reviewed by me for thoroughness and accuracy.     Mila Merry, MD  [facility] [phone] (phone) [phone] (fax)  [facility]

## 2022-08-19 ENCOUNTER — Other Ambulatory Visit: Payer: Self-pay

## 2022-08-28 ENCOUNTER — Other Ambulatory Visit: Payer: Self-pay

## 2022-08-28 ENCOUNTER — Other Ambulatory Visit: Payer: Self-pay | Admitting: Family Medicine

## 2022-08-28 DIAGNOSIS — F988 Other specified behavioral and emotional disorders with onset usually occurring in childhood and adolescence: Secondary | ICD-10-CM

## 2022-08-30 ENCOUNTER — Other Ambulatory Visit: Payer: Self-pay

## 2022-08-30 MED ORDER — AMPHETAMINE-DEXTROAMPHETAMINE 10 MG PO TABS
10.0000 mg | ORAL_TABLET | Freq: Two times a day (BID) | ORAL | 0 refills | Status: DC
  Filled 2022-08-30: qty 60, 30d supply, fill #0

## 2022-08-31 ENCOUNTER — Other Ambulatory Visit: Payer: Self-pay

## 2022-09-01 ENCOUNTER — Other Ambulatory Visit: Payer: Self-pay

## 2023-01-20 ENCOUNTER — Other Ambulatory Visit: Payer: Self-pay

## 2023-01-20 ENCOUNTER — Other Ambulatory Visit: Payer: Self-pay | Admitting: Family Medicine

## 2023-01-20 DIAGNOSIS — G43009 Migraine without aura, not intractable, without status migrainosus: Secondary | ICD-10-CM

## 2023-01-21 ENCOUNTER — Other Ambulatory Visit: Payer: Self-pay | Admitting: Family Medicine

## 2023-01-21 ENCOUNTER — Other Ambulatory Visit: Payer: Self-pay

## 2023-01-21 DIAGNOSIS — G43009 Migraine without aura, not intractable, without status migrainosus: Secondary | ICD-10-CM

## 2023-01-21 NOTE — Telephone Encounter (Signed)
Requested medications are due for refill today.  yes  Requested medications are on the active medications list.  yes  Last refill. 10/27/2021 #30 5 rf  Future visit scheduled.   no  Notes to clinic.  Medication not assigned a protocol. Please review for refill.    Requested Prescriptions  Pending Prescriptions Disp Refills   NURTEC 75 MG TBDP [Pharmacy Med Name: Rimegepant Sulfate (NURTEC) 75 MG Tablet Dispersible] 30 tablet 5    Sig: Take 1 tablet by mouth every other day.     Off-Protocol Failed - 01/21/2023  8:41 AM      Failed - Medication not assigned to a protocol, review manually.      Failed - Valid encounter within last 12 months    Recent Outpatient Visits           8 months ago Chest pain on breathing   [facility] Malva Limes, MD   1 year ago Migraine without aura and responsive to treatment   [facility] Malva Limes, MD   2 years ago Attention deficit disorder (ADD) without hyperactivity    [facility] Malva Limes, MD   2 years ago Anxiety   [facility] Malva Limes, MD   3 years ago Anxiety   [facility] Malva Limes, MD

## 2023-01-22 ENCOUNTER — Other Ambulatory Visit: Payer: Self-pay

## 2023-01-22 MED FILL — Rimegepant Sulfate Tab Disint 75 MG: ORAL | 30 days supply | Qty: 16 | Fill #0 | Status: CN

## 2023-02-11 ENCOUNTER — Other Ambulatory Visit: Payer: Self-pay

## 2023-02-22 ENCOUNTER — Other Ambulatory Visit: Payer: Self-pay

## 2023-02-22 MED FILL — Rimegepant Sulfate Tab Disint 75 MG: ORAL | 30 days supply | Qty: 15 | Fill #0 | Status: AC

## 2023-02-27 ENCOUNTER — Other Ambulatory Visit: Payer: Self-pay | Admitting: Family Medicine

## 2023-02-27 DIAGNOSIS — F988 Other specified behavioral and emotional disorders with onset usually occurring in childhood and adolescence: Secondary | ICD-10-CM

## 2023-03-01 ENCOUNTER — Other Ambulatory Visit: Payer: Self-pay | Admitting: Family Medicine

## 2023-03-01 ENCOUNTER — Other Ambulatory Visit: Payer: Self-pay

## 2023-03-01 DIAGNOSIS — F988 Other specified behavioral and emotional disorders with onset usually occurring in childhood and adolescence: Secondary | ICD-10-CM

## 2023-03-01 NOTE — Telephone Encounter (Signed)
Requested medication (s) are due for refill today: Yes  Requested medication (s) are on the active medication list: Yes  Last refill:  08/30/22  Future visit scheduled: No  Notes to clinic: Unable to refill per protocol, cannot delegate.      Requested Prescriptions  Pending Prescriptions Disp Refills   amphetamine-dextroamphetamine (ADDERALL) 10 MG tablet 60 tablet 0    Sig: Take 1 tablet (10 mg total) by mouth 2 (two) times daily.     Not Delegated - Psychiatry:  Stimulants/ADHD Failed - 02/27/2023  1:02 PM      Failed - This refill cannot be delegated      Failed - Urine Drug Screen completed in last 360 days      Failed - Valid encounter within last 6 months    Recent Outpatient Visits           9 months ago Chest pain on breathing   [facility] Malva Limes, MD   1 year ago Migraine without aura and responsive to treatment   [facility] Malva Limes, MD   2 years ago Attention deficit disorder (ADD) without hyperactivity   [facility] Malva Limes, MD   3 years ago Anxiety   [facility] Malva Limes, MD   3 years ago Anxiety   [facility] Malva Limes, MD              Passed - Last BP in normal range    BP Readings from Last 1 Encounters:  05/25/22 106/73         Passed - Last Heart Rate in normal range    Pulse Readings from Last 1 Encounters:  05/25/22 (!) 58

## 2023-03-01 NOTE — Telephone Encounter (Signed)
Requested by pharmacy 02/27/23 refill encounter, routed to office for provider to review on 03/01/23.

## 2023-03-01 NOTE — Telephone Encounter (Signed)
Medication Refill - Medication: amphetamine-dextroamphetamine (ADDERALL) 10 MG tablet     Pt wife is wanting to make sure nothing is wrong with him getting his refill for his medication. Pt wife states if she need to make an appt for him to get a refill to please let her know and she would like a call back.   Has the patient contacted their pharmacy? Yes.   Pharmacy referred pt to  PCP.    Preferred Pharmacy (with phone number or street name): [facility] [address] [place] [address] Phone: [phone]  Fax: [phone]    Has the patient been seen for an appointment in the last  year OR does the patient have an upcoming appointment? Yes.    Agent: Please be advised that RX refills may take up to 3 business days. We ask that you follow-up with your pharmacy.

## 2023-03-02 ENCOUNTER — Other Ambulatory Visit: Payer: Self-pay

## 2023-03-02 MED ORDER — AMPHETAMINE-DEXTROAMPHETAMINE 10 MG PO TABS
10.0000 mg | ORAL_TABLET | Freq: Two times a day (BID) | ORAL | 0 refills | Status: AC
  Filled 2023-03-02: qty 60, 30d supply, fill #0

## 2023-07-20 ENCOUNTER — Other Ambulatory Visit: Payer: Self-pay

## 2023-07-20 MED FILL — Rimegepant Sulfate Tab Disint 75 MG: ORAL | 30 days supply | Qty: 15 | Fill #1 | Status: AC

## 2023-10-26 ENCOUNTER — Other Ambulatory Visit: Payer: Self-pay

## 2023-10-26 MED FILL — Rimegepant Sulfate Tab Disint 75 MG: ORAL | 30 days supply | Qty: 15 | Fill #2 | Status: AC

## 2024-02-15 ENCOUNTER — Other Ambulatory Visit: Payer: Self-pay

## 2024-02-15 ENCOUNTER — Other Ambulatory Visit: Payer: Self-pay | Admitting: Family Medicine

## 2024-02-15 DIAGNOSIS — G43009 Migraine without aura, not intractable, without status migrainosus: Secondary | ICD-10-CM

## 2024-02-16 ENCOUNTER — Other Ambulatory Visit: Payer: Self-pay

## 2024-02-17 ENCOUNTER — Other Ambulatory Visit: Payer: Self-pay

## 2024-02-17 MED FILL — Rimegepant Sulfate Tab Disint 75 MG: ORAL | 30 days supply | Qty: 15 | Fill #0 | Status: CN

## 2024-02-18 ENCOUNTER — Other Ambulatory Visit: Payer: Self-pay

## 2024-02-28 ENCOUNTER — Other Ambulatory Visit: Payer: Self-pay

## 2024-02-29 ENCOUNTER — Other Ambulatory Visit: Payer: Self-pay

## 2024-02-29 MED FILL — Rimegepant Sulfate Tab Disint 75 MG: ORAL | 30 days supply | Qty: 15 | Fill #0 | Status: AC

## 2024-03-20 ENCOUNTER — Other Ambulatory Visit: Payer: Self-pay

## 2024-03-20 MED ORDER — AMPHETAMINE-DEXTROAMPHETAMINE 10 MG PO TABS
10.0000 mg | ORAL_TABLET | Freq: Two times a day (BID) | ORAL | 0 refills | Status: AC
  Filled 2024-03-20: qty 60, 30d supply, fill #0

## 2024-05-29 ENCOUNTER — Other Ambulatory Visit: Payer: Self-pay

## 2024-05-30 ENCOUNTER — Other Ambulatory Visit: Payer: Self-pay

## 2024-05-31 ENCOUNTER — Other Ambulatory Visit: Payer: Self-pay

## 2024-05-31 MED ORDER — NURTEC 75 MG PO TBDP
1.0000 | ORAL_TABLET | ORAL | 0 refills | Status: DC
  Filled 2024-05-31: qty 15, 30d supply, fill #0

## 2024-07-03 ENCOUNTER — Other Ambulatory Visit: Payer: Self-pay

## 2024-07-03 MED ORDER — NURTEC 75 MG PO TBDP
75.0000 mg | ORAL_TABLET | ORAL | 1 refills | Status: AC
  Filled 2024-07-03: qty 15, 30d supply, fill #0
